# Patient Record
Sex: Female | Born: 1965 | Race: White | Hispanic: No | Marital: Married | Smoking: Never smoker
Health system: Southern US, Community
[De-identification: ages and names within clinical notes are randomized; demographics above are authoritative.]

## PROBLEM LIST (undated history)

## (undated) DIAGNOSIS — I82409 Acute embolism and thrombosis of unspecified deep veins of unspecified lower extremity: Secondary | ICD-10-CM

## (undated) HISTORY — PX: ELBOW ARTHROSCOPY: SUR87

## (undated) HISTORY — DX: Acute embolism and thrombosis of unspecified deep veins of unspecified lower extremity: I82.409

---

## 2004-08-18 ENCOUNTER — Ambulatory Visit: Payer: Self-pay | Admitting: Unknown Physician Specialty

## 2004-08-24 ENCOUNTER — Ambulatory Visit: Payer: Self-pay | Admitting: Unknown Physician Specialty

## 2005-09-13 ENCOUNTER — Ambulatory Visit: Payer: Self-pay | Admitting: Unknown Physician Specialty

## 2006-02-05 ENCOUNTER — Ambulatory Visit: Payer: Self-pay | Admitting: Internal Medicine

## 2006-02-21 ENCOUNTER — Ambulatory Visit: Payer: Self-pay | Admitting: Internal Medicine

## 2006-03-24 ENCOUNTER — Ambulatory Visit: Payer: Self-pay | Admitting: Internal Medicine

## 2006-04-10 ENCOUNTER — Ambulatory Visit: Payer: Self-pay | Admitting: Unknown Physician Specialty

## 2006-04-23 ENCOUNTER — Ambulatory Visit: Payer: Self-pay | Admitting: Internal Medicine

## 2006-05-08 ENCOUNTER — Ambulatory Visit: Payer: Self-pay

## 2006-05-16 ENCOUNTER — Ambulatory Visit: Payer: Self-pay | Admitting: Unknown Physician Specialty

## 2006-09-06 ENCOUNTER — Ambulatory Visit: Payer: Self-pay | Admitting: Unknown Physician Specialty

## 2006-11-30 ENCOUNTER — Ambulatory Visit: Payer: Self-pay | Admitting: Unknown Physician Specialty

## 2007-12-13 ENCOUNTER — Ambulatory Visit: Payer: Self-pay | Admitting: Orthopaedic Surgery

## 2007-12-19 ENCOUNTER — Ambulatory Visit: Payer: Self-pay | Admitting: Orthopaedic Surgery

## 2008-01-01 ENCOUNTER — Ambulatory Visit: Payer: Self-pay | Admitting: Unknown Physician Specialty

## 2008-01-13 ENCOUNTER — Ambulatory Visit: Payer: Self-pay | Admitting: Unknown Physician Specialty

## 2008-08-03 IMAGING — US ABDOMEN ULTRASOUND
1 series · 17 of 25 positions shown · non-contrast
Comparison: none

REASON FOR EXAM: Elevated transaminase
COMMENTS:

[Series 1: abdomen ultrasound · 17 of 66 slices shown]
[im 1/66]
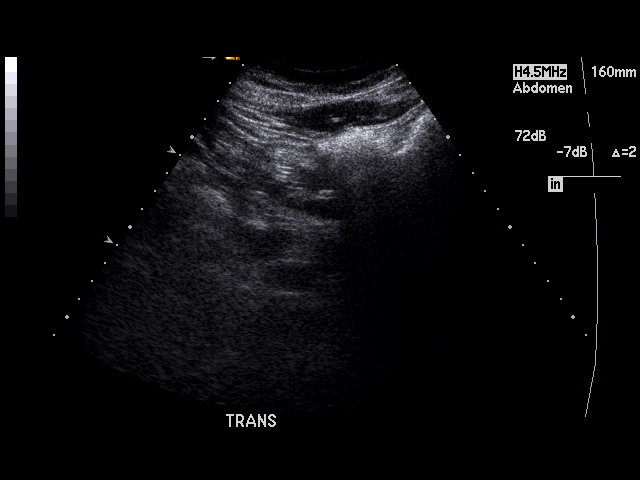
[im 6/66]
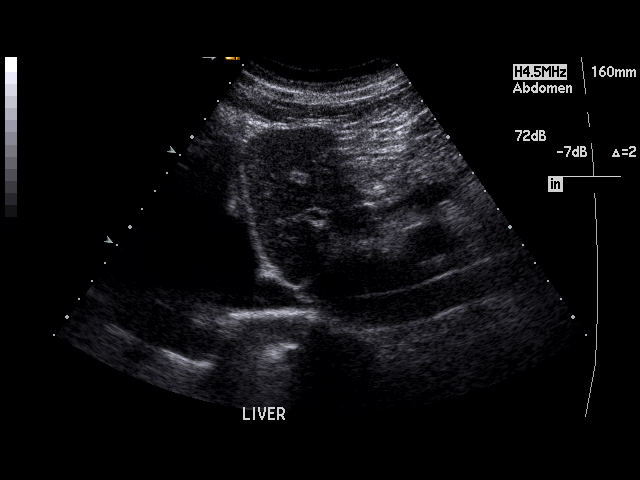
[im 9/66]
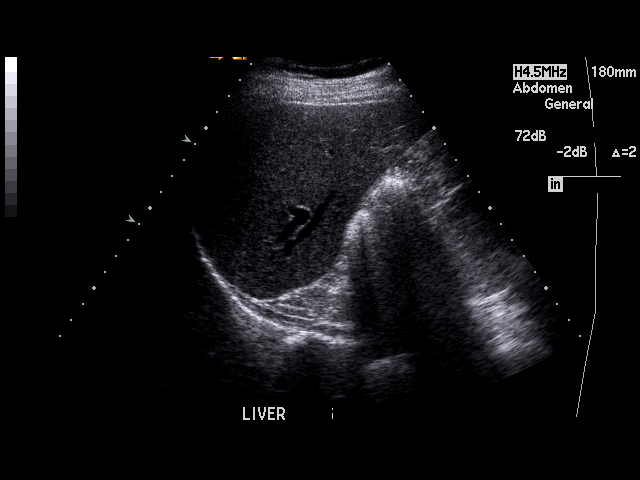
[im 14/66]
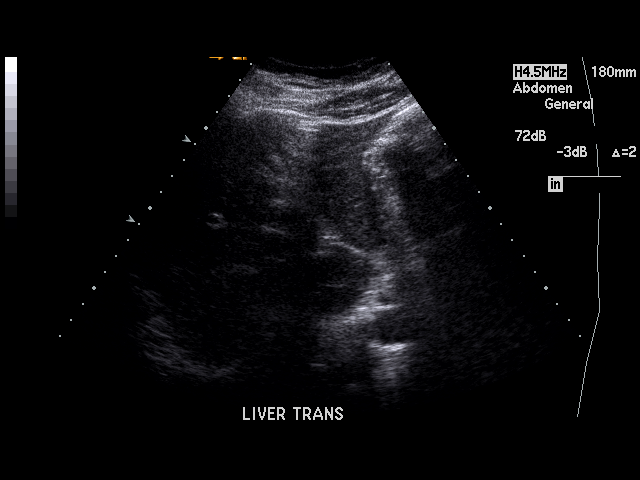
[im 17/66]
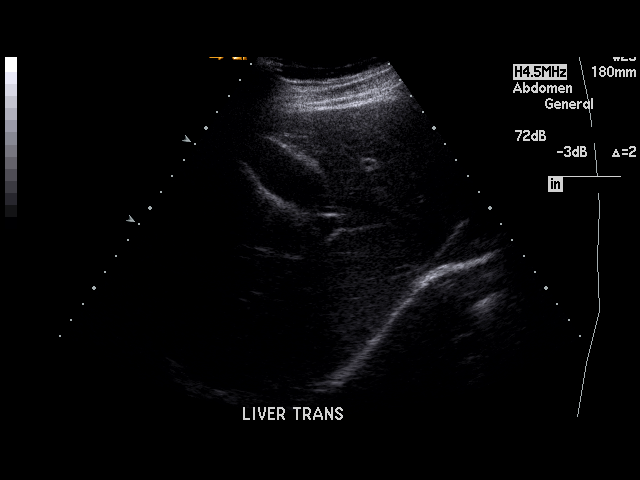
[im 22/66]
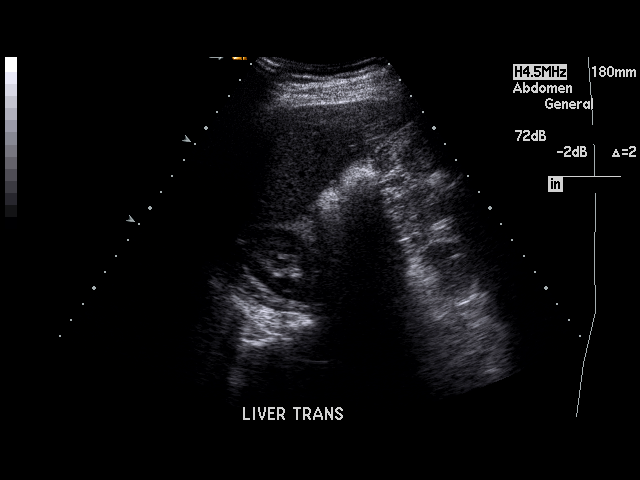
[im 25/66]
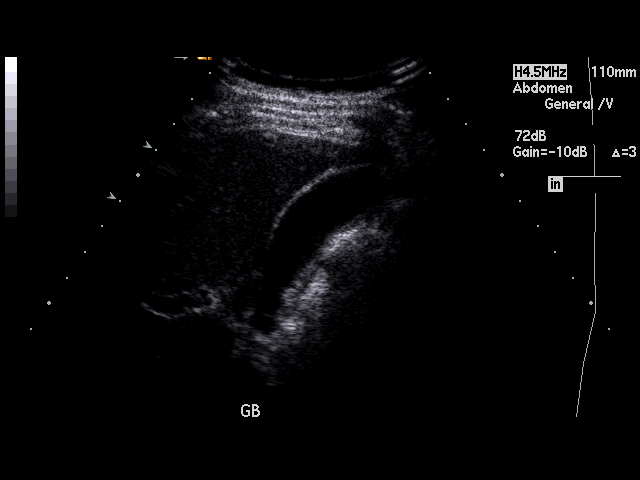
[im 30/66]
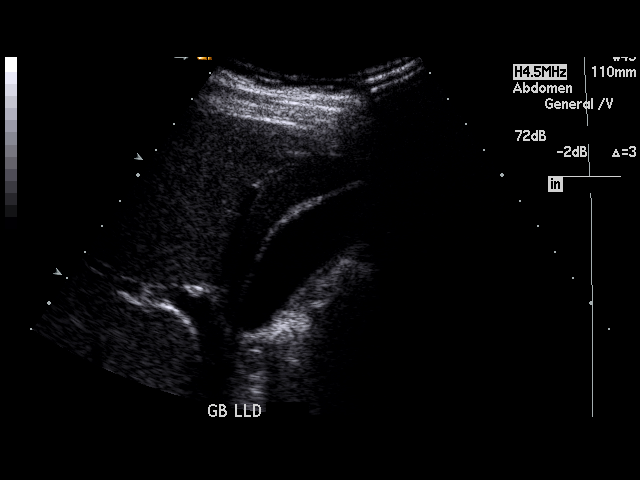
[im 33/66]
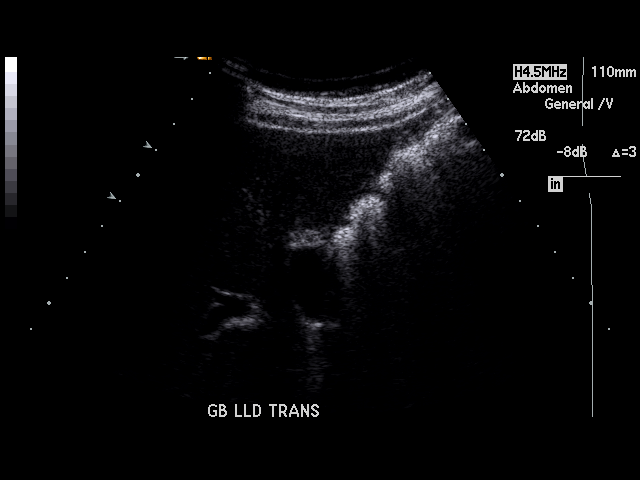
[im 36/66]
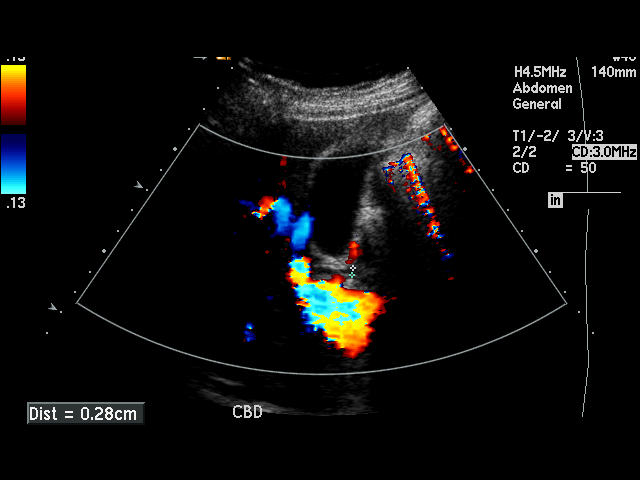
[im 41/66]
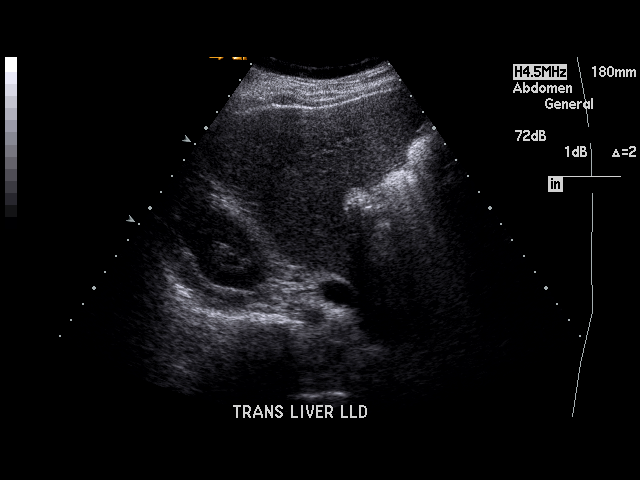
[im 44/66]
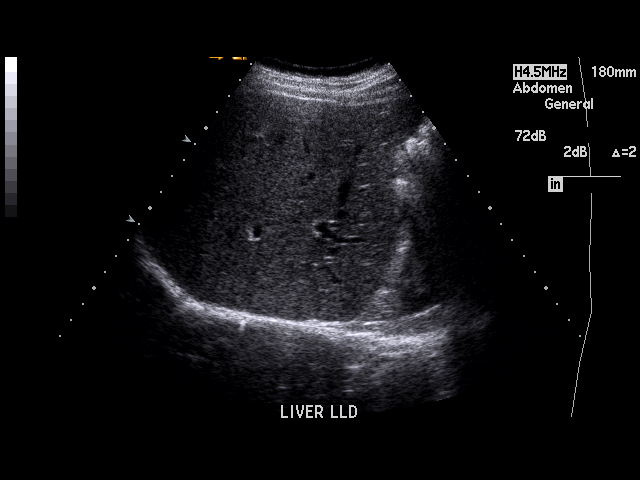
[im 49/66]
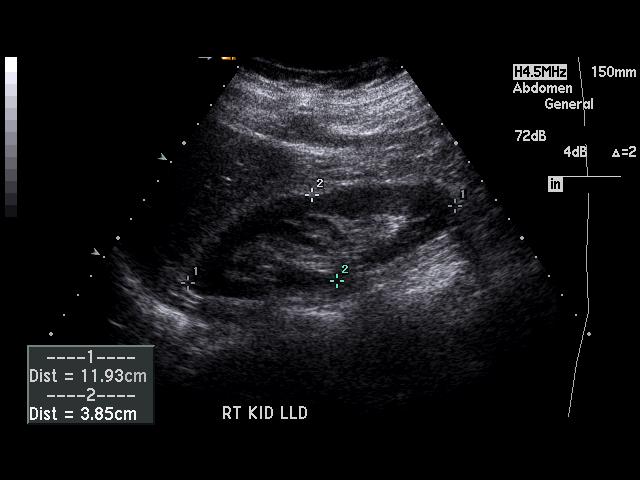
[im 52/66]
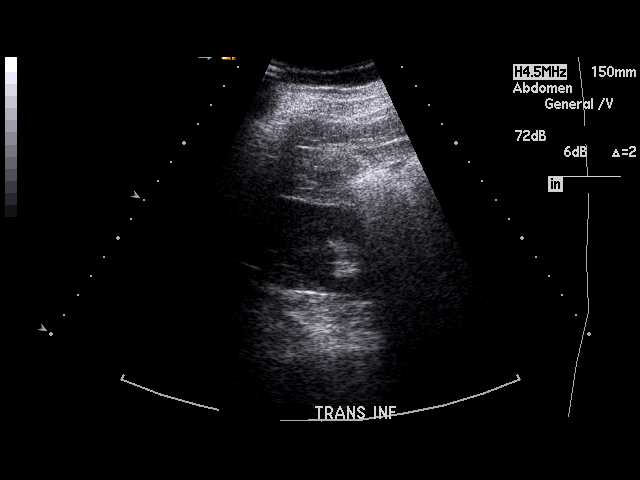
[im 57/66]
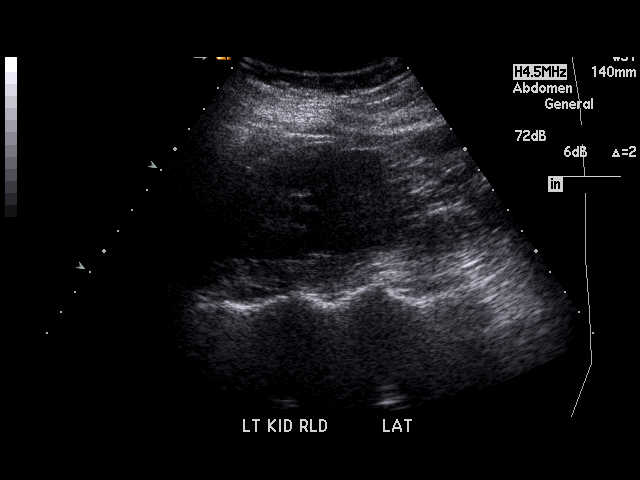
[im 60/66]
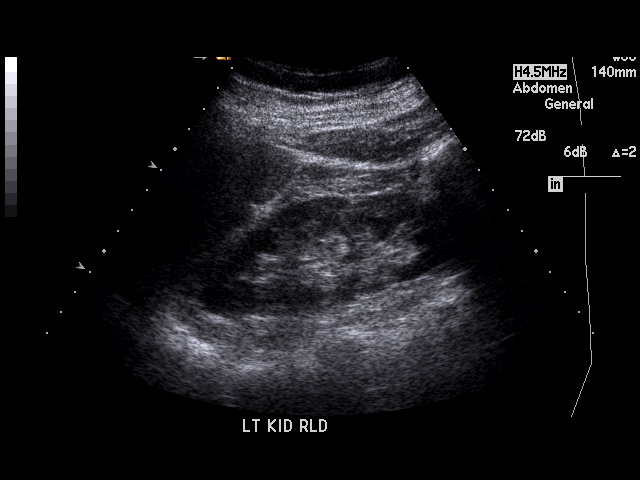
[im 66/66]
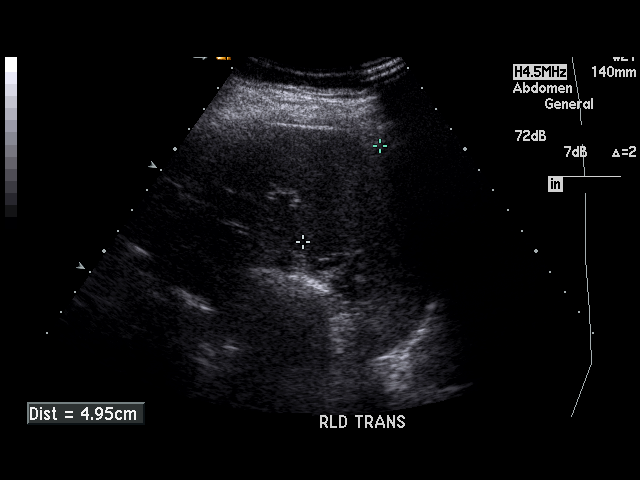

[17 of 25 positions shown; findings below may reference images not displayed]

PROCEDURE:     US  - US ABDOMEN GENERAL SURVEY  - April 10, 2006  [DATE]

RESULT:     The patient has elevated transaminases.

The liver exhibits normal echotexture with no mass or ductal dilation.  The
gallbladder is adequately distended with no evidence of stones, wall
thickening, or pericholecystic fluid.  There is no sonographic Murphy's
sign.  The common bile duct measures 2.8 mm in diameter.  The pancreas could
not be demonstrated due to the presence of bowel gas.  The spleen, kidneys,
and abdominal aorta are normal in appearance.  There is no evidence of
ascites.
IMPRESSION: I see no acute abnormality of the hepatobiliary tree but the pancreas could
not be demonstrated due to the presence of bowel gas.

## 2009-01-12 ENCOUNTER — Ambulatory Visit: Payer: Self-pay | Admitting: Unknown Physician Specialty

## 2010-01-13 ENCOUNTER — Ambulatory Visit: Payer: Self-pay | Admitting: Unknown Physician Specialty

## 2011-01-30 ENCOUNTER — Ambulatory Visit: Payer: Self-pay | Admitting: Unknown Physician Specialty

## 2012-04-23 ENCOUNTER — Ambulatory Visit: Payer: Self-pay | Admitting: Family Medicine

## 2012-04-23 ENCOUNTER — Other Ambulatory Visit: Payer: Self-pay | Admitting: Family Medicine

## 2012-04-23 LAB — APTT: Activated PTT: 26 secs (ref 23.6–35.9)

## 2012-10-10 ENCOUNTER — Ambulatory Visit: Payer: Self-pay | Admitting: Cardiology

## 2012-11-24 ENCOUNTER — Ambulatory Visit: Payer: Self-pay | Admitting: Family Medicine

## 2013-02-07 ENCOUNTER — Ambulatory Visit: Payer: Self-pay | Admitting: Family Medicine

## 2013-02-14 ENCOUNTER — Ambulatory Visit: Payer: Self-pay | Admitting: Family Medicine

## 2013-04-15 ENCOUNTER — Ambulatory Visit: Payer: Self-pay | Admitting: Cardiology

## 2013-09-24 ENCOUNTER — Ambulatory Visit: Payer: Self-pay | Admitting: Family Medicine

## 2015-01-08 ENCOUNTER — Other Ambulatory Visit: Payer: Self-pay | Admitting: Obstetrics and Gynecology

## 2015-01-08 DIAGNOSIS — N6489 Other specified disorders of breast: Secondary | ICD-10-CM

## 2015-01-19 ENCOUNTER — Other Ambulatory Visit: Payer: Commercial Managed Care - PPO

## 2015-01-19 ENCOUNTER — Ambulatory Visit: Payer: Commercial Managed Care - PPO

## 2015-01-20 ENCOUNTER — Ambulatory Visit: Payer: Commercial Managed Care - PPO

## 2015-01-20 ENCOUNTER — Ambulatory Visit
Admission: RE | Admit: 2015-01-20 | Discharge: 2015-01-20 | Disposition: A | Discharge status: Home / Self Care | Payer: Commercial Managed Care - PPO | Source: Ambulatory Visit | Attending: Obstetrics and Gynecology | Admitting: Obstetrics and Gynecology

## 2015-01-20 DIAGNOSIS — N6489 Other specified disorders of breast: Secondary | ICD-10-CM | POA: Insufficient documentation

## 2015-01-20 DIAGNOSIS — R87612 Low grade squamous intraepithelial lesion on cytologic smear of cervix (LGSIL): Secondary | ICD-10-CM | POA: Insufficient documentation

## 2015-01-21 ENCOUNTER — Other Ambulatory Visit: Payer: Commercial Managed Care - PPO

## 2015-01-21 ENCOUNTER — Ambulatory Visit: Payer: Commercial Managed Care - PPO

## 2015-04-22 ENCOUNTER — Encounter: Payer: Self-pay | Admitting: Family Medicine

## 2016-01-11 ENCOUNTER — Other Ambulatory Visit: Payer: Self-pay | Admitting: Obstetrics and Gynecology

## 2016-01-11 DIAGNOSIS — Z1231 Encounter for screening mammogram for malignant neoplasm of breast: Secondary | ICD-10-CM

## 2016-02-04 ENCOUNTER — Other Ambulatory Visit: Payer: Self-pay | Admitting: Obstetrics and Gynecology

## 2016-02-04 ENCOUNTER — Ambulatory Visit
Admission: RE | Admit: 2016-02-04 | Discharge: 2016-02-04 | Disposition: A | Discharge status: Home / Self Care | Payer: Commercial Managed Care - PPO | Source: Ambulatory Visit | Attending: Obstetrics and Gynecology | Admitting: Obstetrics and Gynecology

## 2016-02-04 DIAGNOSIS — Z1231 Encounter for screening mammogram for malignant neoplasm of breast: Secondary | ICD-10-CM

## 2016-06-06 ENCOUNTER — Ambulatory Visit (INDEPENDENT_AMBULATORY_CARE_PROVIDER_SITE_OTHER): Payer: Commercial Managed Care - PPO | Admitting: Sports Medicine

## 2016-06-06 ENCOUNTER — Ambulatory Visit (INDEPENDENT_AMBULATORY_CARE_PROVIDER_SITE_OTHER): Payer: Commercial Managed Care - PPO

## 2016-06-06 ENCOUNTER — Encounter: Payer: Self-pay | Admitting: Sports Medicine

## 2016-06-06 DIAGNOSIS — M79671 Pain in right foot: Secondary | ICD-10-CM

## 2016-06-06 DIAGNOSIS — M779 Enthesopathy, unspecified: Secondary | ICD-10-CM | POA: Diagnosis not present

## 2016-06-06 MED ORDER — TRIAMCINOLONE ACETONIDE 10 MG/ML IJ SUSP: 10.0000 mg | Freq: Once | INTRAMUSCULAR | Status: DC

## 2016-06-06 NOTE — Progress Notes (Signed)
Subjective: Leslie Jacobs is a 50 y.o. female patient who presents to office for evaluation of right foot pain. Patient complains of progressive pain especially over the last 3 weeks in the right foot at the ball; feel like she is walking on a marble that is worsened by barefoot walking. Patient has tried change in shoe, ice, changing way she walks with no relief in symptoms. Patient denies any other pedal complaints. Denies injury/trip/fall/sprain/any other causative factors.   Patient Active Problem List   Diagnosis Date Noted  . Low grade squamous intraepithelial lesion (LGSIL) on Papanicolaou smear of cervix 01/20/2015    No current outpatient prescriptions on file prior to visit.   No current facility-administered medications on file prior to visit.     Allergies  Allergen Reactions  . Hydrocodone-Acetaminophen Nausea Only  . Sulfa Antibiotics Other (See Comments)    Objective:  General: Alert and oriented x3 in no acute distress  Dermatology: No open lesions bilateral lower extremities, no webspace macerations, no ecchymosis bilateral, all nails x 10 are well manicured.  Vascular: Dorsalis Pedis and Posterior Tibial pedal pulses palpable, Capillary Fill Time 3 seconds,(+) pedal hair growth bilateral, no edema bilateral lower extremities, Temperature gradient within normal limits.  Neurology: Johney Maine sensation intact via light touch bilateral, (- )Tinels sign bilateral.   Musculoskeletal: Mild tenderness with palpation sub met 3 right foot with mild fat pad migration and lesser hammertoe,No pain with calf compression bilateral. Strength within normal limits in all groups bilateral.   Gait: Antalgic gait  Xrays  Right Foot   Impression: Normal osseous mineralization, square 2nd met head, plantarflex met, hammertoes, inferior calcaneal spur. No fracture. Soft tissues within normal limits.  Assessment and Plan: Problem List Items Addressed This Visit    None    Visit  Diagnoses    Right foot pain    -  Primary   Relevant Medications   triamcinolone acetonide (KENALOG) 10 MG/ML injection 10 mg (Start on 06/06/2016  9:00 AM)   Other Relevant Orders   DG Foot 2 Views Right   Capsulitis       Relevant Medications   triamcinolone acetonide (KENALOG) 10 MG/ML injection 10 mg (Start on 06/06/2016  9:00 AM)       -Complete examination performed -Xrays reviewed -Discussed treatement options for capsulitis secondary to foot type After oral consent and aseptic prep, injected a mixture containing 1 ml of 2% plain lidocaine, 1 ml 0.5% plain marcaine, 0.5 ml of kenalog 10 and 0.5 ml of dexamethasone phosphate into sub met 3 on right without complication. Post-injection care discussed with patient.  -Applied taping/removal met pad to right foot -Recommend ice, rest, good shoes daily. NO BAREFOOT WALKING.  -Patient to return to office as needed or sooner if condition worsens.  Landis Martins, DPM

## 2016-08-24 ENCOUNTER — Other Ambulatory Visit: Payer: Self-pay | Admitting: Obstetrics and Gynecology

## 2016-08-24 DIAGNOSIS — R590 Localized enlarged lymph nodes: Secondary | ICD-10-CM

## 2016-09-07 ENCOUNTER — Ambulatory Visit
Admission: RE | Admit: 2016-09-07 | Discharge: 2016-09-07 | Disposition: A | Discharge status: Home / Self Care | Payer: Commercial Managed Care - PPO | Source: Ambulatory Visit | Attending: Obstetrics and Gynecology | Admitting: Obstetrics and Gynecology

## 2016-09-07 ENCOUNTER — Encounter: Payer: Self-pay | Admitting: Radiology

## 2016-09-07 DIAGNOSIS — R590 Localized enlarged lymph nodes: Secondary | ICD-10-CM | POA: Diagnosis present

## 2016-09-07 DIAGNOSIS — N6310 Unspecified lump in the right breast, unspecified quadrant: Secondary | ICD-10-CM | POA: Insufficient documentation

## 2017-01-11 ENCOUNTER — Other Ambulatory Visit: Payer: Self-pay | Admitting: Obstetrics and Gynecology

## 2017-01-11 DIAGNOSIS — Z1231 Encounter for screening mammogram for malignant neoplasm of breast: Secondary | ICD-10-CM

## 2017-02-09 ENCOUNTER — Ambulatory Visit
Admission: RE | Admit: 2017-02-09 | Discharge: 2017-02-09 | Disposition: A | Discharge status: Home / Self Care | Payer: Commercial Managed Care - PPO | Source: Ambulatory Visit | Attending: Obstetrics and Gynecology | Admitting: Obstetrics and Gynecology

## 2017-02-09 DIAGNOSIS — Z1231 Encounter for screening mammogram for malignant neoplasm of breast: Secondary | ICD-10-CM | POA: Diagnosis present

## 2017-05-18 DIAGNOSIS — K900 Celiac disease: Secondary | ICD-10-CM | POA: Insufficient documentation

## 2017-05-18 DIAGNOSIS — I10 Essential (primary) hypertension: Secondary | ICD-10-CM | POA: Insufficient documentation

## 2018-01-15 ENCOUNTER — Other Ambulatory Visit: Payer: Self-pay | Admitting: Obstetrics and Gynecology

## 2018-01-15 DIAGNOSIS — Z1231 Encounter for screening mammogram for malignant neoplasm of breast: Secondary | ICD-10-CM

## 2018-02-15 ENCOUNTER — Ambulatory Visit
Admission: RE | Admit: 2018-02-15 | Discharge: 2018-02-15 | Disposition: A | Discharge status: Home / Self Care | Payer: Commercial Managed Care - PPO | Source: Ambulatory Visit | Attending: Obstetrics and Gynecology | Admitting: Obstetrics and Gynecology

## 2018-02-15 DIAGNOSIS — Z1231 Encounter for screening mammogram for malignant neoplasm of breast: Secondary | ICD-10-CM | POA: Diagnosis present

## 2018-04-26 DIAGNOSIS — S46912A Strain of unspecified muscle, fascia and tendon at shoulder and upper arm level, left arm, initial encounter: Secondary | ICD-10-CM | POA: Insufficient documentation

## 2018-05-03 ENCOUNTER — Ambulatory Visit: Payer: Commercial Managed Care - PPO

## 2018-05-17 ENCOUNTER — Other Ambulatory Visit: Payer: Self-pay

## 2018-05-17 ENCOUNTER — Ambulatory Visit: Payer: Commercial Managed Care - PPO | Attending: Obstetrics and Gynecology

## 2018-05-17 DIAGNOSIS — M533 Sacrococcygeal disorders, not elsewhere classified: Secondary | ICD-10-CM | POA: Insufficient documentation

## 2018-05-17 DIAGNOSIS — M62838 Other muscle spasm: Secondary | ICD-10-CM | POA: Insufficient documentation

## 2018-05-17 DIAGNOSIS — N8110 Cystocele, unspecified: Secondary | ICD-10-CM

## 2018-05-17 NOTE — Patient Instructions (Signed)
Stabilization: Diaphragmatic Breathing    Lie with knees bent, feet flat. Place one hand on stomach, other on chest. Breathe deeply through nose, lifting belly hand without any motion of hand on chest. 3-5 minutes each day.  Child's Pose Pelvic Floor Lengthening    Sit in knee-chest position and reach arms forward. Separate knees for comfort. Hold position for _5__ breaths. Repeat _2-3__ times. Do _1-2__ times per day.    Hold for 30 seconds (5 deep breaths) and repeat 2-3 times on each side once a day

## 2018-05-17 NOTE — Therapy (Signed)
[place] [facility] MAIN [facility] SERVICES [address] [place], [place], [address] Phone: [phone]   Fax:  [phone]  Physical Therapy Evaluation  Patient Details  Name: Leslie Jacobs MRN: [id number] Date of Birth: [birth year] Referring Provider (PT): Dr. Christeen Douglas   Encounter Date: 05/17/2018    No past medical history on file.  No past surgical history on file.  There were no vitals filed for this visit.       [facility] PT Assessment - 05/17/18 0001      Assessment   Medical Diagnosis  incomplete uterovaginal prolapse    Referring Provider (PT)  Dr. Christeen Douglas    Onset Date/Surgical Date  01/15/18    Next MD Visit  6/20    Prior Therapy  none      Precautions   Precautions  None      Restrictions   Weight Bearing Restrictions  No      Balance Screen   Has the patient fallen in the past 6 months  No      Home Environment   Living Environment  Private residence    Living Arrangements  Spouse/significant other    Available Help at Discharge  Family    Type of Home  House    Home Access  Stairs to enter    Entrance Stairs-Number of Steps  3    Entrance Stairs-Rails  Left    Home Layout  Two level    Alternate Level Stairs-Number of Steps  20    Alternate Level Stairs-Rails  Right    Home Equipment  None      Prior Function   Level of Independence  Independent    Vocation  Full time employment    Company secretary   standing, walking, drives   Leisure  Exercise kickboxing, Education administrator, hitt, pilates, ~4-5 days/week      Cognition   Overall Cognitive Status  Within Functional Limits for tasks assessed        Pelvic Floor Physical Therapy Evaluation and Assessment  SCREENING  Falls in last 6 mo: no     Red Flags:  Have you had any night sweats? Yes, perimenapausal Unexplained weight loss? no Saddle anesthesia? no Unexplained changes in bowel or bladder habits?  no  SUBJECTIVE  Patient reports: She started noticing a heaviness and could palpate a bulge in the shower ~ 3-4 months ago.  Social/Family/Vocational History:   Working full time as Academic librarian  Recent Procedures/Tests/Findings:  none  Obstetrical History: Had a fourth degree tear with last child, had a c-section then v-back.  Gynecological History: none  Urinary History: Has urinary frequency occasionally can feel the urge to go as early as 20 min. After emptying bladder but can hold off up to 3 hours if distracted/working.  Gastrointestinal History: Daily, not usually straining ~ grade 4   Sexual activity/pain: Can have pain with intercourse since noticing prolapse.  Patient Goals: Wants to not have a prolapse.    OBJECTIVE  Posture/Observations:  Sitting:  Standing: L iliac crest high, L ASIS high, L PSIS low  L anterior rotation and up-slip. ~ 1/2 cm leg-length difference pre-treatment.    Palpation/Segmental Motion/Joint Play: Not TTP through adductors, TTP through lumbar paraspinals B with L>R  Special tests:   Stork: L side mild instability, R hypomobility Scoliosis: mild R lumbar curve. Leg-length: L 90.5, R 90mm Supine to long-sit: L long in supine and seated.  Range of Motion/Flexibilty:  Spine: ~50% decreased rotation to L and ~ 35% decreased to R. SB WNL but little motion coming from lumbar spine.  Hips: not assessed   Strength/MMT: Deferred to follow-up visit LE MMT  LE MMT Left Right  Hip flex:  (L2) /5 /5  Hip ext: /5 /5  Hip abd: /5 /5  Hip add: /5 /5  Hip IR /5 /5  Hip ER /5 /5     Abdominal:  Palpation: TTP B (L>R) to Psoas only Diastasis: none  Pelvic Floor External Exam: Introitus Appears: gaping Skin integrity: normal Palpation: no TTP Cough: paradoxical Prolapse visible?: yes Scar mobility: some decreased mobility  Internal Vaginal Exam: Strength (PERF): 2/5, 8 seconds,1 time Symmetry: L>R for  tenderness Palpation: TTP throughout, L>R Prolapse: anterior and posterior walls visible at the level of the introitus.  Gait Analysis: not assessed   Pelvic Floor Outcome Measures: PFDI: 39/300  INTERVENTIONS THIS SESSION: Self-care: Educated on the structure and function of the pelvic floor in relation to their symptoms as well as the POC, and initial HEP in order to set patient expectations and understanding from which we will build on in the future sessions. Therex: educated on and practiced diaphragmatic breathing, side-stretch, and child's pose to begin lengthening the PFM and decrease imbalance of musculature surrounding the pelvis.  Total time: 60 min.         Objective measurements completed on examination: See above findings.                PT Short Term Goals - 05/17/18 1115      PT SHORT TERM GOAL #1   Title  Patient will demonstrate functional recruitment of TA with breathing, sit-to-stand, squatting/lifting, and walking to allow for improved pelvic brace coordination, improved balance, and decreased downward pressure on the pelvic organs.    Baseline  chest breathing    Time  6    Period  Weeks    Status  New    Target Date  06/28/18      PT SHORT TERM GOAL #2   Title  Patient will demonstrate improved pelvic alignment and balance of musculature surrounding the pelvis to facilitate decreased PFM spasms and decrease pelvic pain.    Baseline  L anterior rotation and up-slip (possible leg-length discrepancy)    Time  6    Period  Weeks    Status  New    Target Date  06/28/18      PT SHORT TERM GOAL #3   Title  Patient will demonstrate HEP x1 in the clinic to demonstrate understanding and proper form to allow for further improvement.    Time  6    Period  Weeks    Status  New    Target Date  06/28/18        PT Long Term Goals - 05/17/18 1112      PT LONG TERM GOAL #1   Title  Patient will score at or below 20 on the PFDI to demonstrate a  clinically meaningful decrease in disability and distress due to pelvic floor dysfunction.    Baseline  PFDI: 39/300    Time  12    Period  Weeks    Status  New    Target Date  08/09/18      PT LONG TERM GOAL #2   Title  Patient will demonstrate 4+/5 strength or better in the PF and surrounding musculature to show improved functional strength for childcare and other vocational and recreational  activities.     Baseline  2/5 for PFM    Time  12    Period  Weeks    Status  New    Target Date  08/09/18      PT LONG TERM GOAL #3   Title  Patient will report no pain with intercourse to demonstrate improved functional ability.    Baseline  occasional pain with intercourse    Time  12    Period  Weeks    Status  New    Target Date  08/09/18      PT LONG TERM GOAL #4   Title  Patient will report urinating 6-8 times per day over the course of the prior week to demonstrate decreased frequency.    Baseline  Has urge up to every 20 min. at times, can delay if "ignores" it.    Time  12    Period  Weeks    Status  New    Target Date  08/09/18      PT LONG TERM GOAL #5   Title  Patient will describe feeling of pressure no more than 10% of the time over the course of the past week to demonstrate improved recruitment and strength of the pelvic floor.    Time  12    Period  Weeks    Status  New    Target Date  08/09/18             Plan - 05/17/18 1125    Clinical Impression Statement  Pt. is a 52 y/o female who presents today with cheif c/o vaginal heaviness/bulge and occasional pain in the low back and vaginally with intercourse. Her relevant PMH includes 1 vaginal delivery with grade 4 tearing and a c-section. Her clinical exam reveals a L posterior rotation and up-slip with possible leg-length difference to be confirmed following correction of obliquity as well as spasms surrounding and within the pelvis and weak PFM. She will benefit from skilled pelvic PT to address noted defecits  and to continue to assess for and address other potential causes of pain and prolapse Sx.     History and Personal Factors relevant to plan of care:  1 c-section, 1 vaginal delivery with grade 4 tear    Clinical Presentation  Stable    Clinical Decision Making  Moderate    Rehab Potential  Good    PT Frequency  1x / week    PT Duration  12 weeks    PT Treatment/Interventions  ADLs/Self Care Home Management;Biofeedback;Electrical Stimulation;Traction;Moist Heat;Functional mobility training;Therapeutic activities;Therapeutic exercise;Patient/family education;Neuromuscular re-education;Scar mobilization;Taping;Dry needling;Spinal Manipulations;Joint Manipulations    PT Next Visit Plan  address L up-slip/posterior rotation and re-measure leg-length. Hip flexor stretch Edcuate on self posterior fourchette massage, perform TP release internally    PT Home Exercise Plan  diaphragmatic breathing, child's pose, side-stretch    Consulted and Agree with Plan of Care  Patient       Patient will benefit from skilled therapeutic intervention in order to improve the following deficits and impairments:  Pain, Decreased coordination, Decreased scar mobility, Increased muscle spasms, Postural dysfunction, Decreased activity tolerance, Decreased endurance, Decreased strength, Impaired flexibility, Hypomobility  Visit Diagnosis: Other muscle spasm  Sacrococcygeal disorders, not elsewhere classified  Vaginal prolapse     Problem List Patient Active Problem List   Diagnosis Date Noted  . Low grade squamous intraepithelial lesion (LGSIL) on Papanicolaou smear of cervix 01/20/2015   Cleophus Molt DPT, ATC Cleophus Molt 05/17/2018,  11:42 AM  [place] [facility] MAIN [facility] SERVICES [address] [place], [place], [address] Phone: [phone]   Fax:  [phone]  Name: Leslie Jacobs MRN: [id number] Date of Birth: [birth year]

## 2018-05-24 ENCOUNTER — Ambulatory Visit: Payer: Commercial Managed Care - PPO | Attending: Obstetrics and Gynecology

## 2018-05-24 DIAGNOSIS — N8110 Cystocele, unspecified: Secondary | ICD-10-CM | POA: Diagnosis present

## 2018-05-24 DIAGNOSIS — M533 Sacrococcygeal disorders, not elsewhere classified: Secondary | ICD-10-CM

## 2018-05-24 DIAGNOSIS — M62838 Other muscle spasm: Secondary | ICD-10-CM | POA: Diagnosis not present

## 2018-05-24 NOTE — Therapy (Signed)
[place] MAIN [facility] SERVICES [address] [place], [place], [address] Phone: [phone]   Fax:  [phone]  Physical Therapy Treatment  Patient Details  Name: Leslie Jacobs MRN: [id number] Date of Birth: February 21, 1966 Referring Provider (PT): Dr. Benjaman Kindler   Encounter Date: 05/24/2018  PT End of Session - 05/27/18 1122    Visit Number  2    Number of Visits  12    Date for PT Re-Evaluation  08/09/17    PT Start Time  1000    PT Stop Time  1100    PT Time Calculation (min)  60 min    Activity Tolerance  Patient tolerated treatment well    Behavior During Therapy  [facility] for tasks assessed/performed       No past medical history on file.  No past surgical history on file.  There were no vitals filed for this visit.   Pelvic Floor Physical Therapy Treatment Note  SCREENING  Changes in medications, allergies, or medical history?: no   SUBJECTIVE  Patient reports: No changes, has done her exercises all but one day.    Pain update: No pain  Patient Goals: Wants to not have a prolapse.    OBJECTIVE  Changes in: Posture/Observations:  L posterior rotation and up-slip  Range of Motion/Flexibilty:  Decreased sacral mobility on L>R  Palpation: TTP through  L QL, lumbar paraspinals, Glute min.  INTERVENTIONS THIS SESSION: Manual: Performed PA mobs to L>R sacral border and TP release to L QL, lumbar paraspinals, Glute min. Followed by MET correction for posterior rotation and up-slip correction for L hip to improve alignment and allow for decreased mal-alignment and pressure on lumbosacral nerve roots to allow for improved recruitment of PFM.  Therex: educated on and practiced self MET correction to decrease R anterior/L posterior rotation and decrease pressure on nerve roots and allow for improved muscular control.   Total time: 60 min.                           PT Education - 05/27/18 1121     Education Details  See Pt. Instructions and Interventions this session.    Person(s) Educated  Patient    Methods  Explanation    Comprehension  Verbalized understanding       PT Short Term Goals - 05/17/18 1115      PT SHORT TERM GOAL #1   Title  Patient will demonstrate functional recruitment of TA with breathing, sit-to-stand, squatting/lifting, and walking to allow for improved pelvic brace coordination, improved balance, and decreased downward pressure on the pelvic organs.    Baseline  chest breathing    Time  6    Period  Weeks    Status  New    Target Date  06/28/18      PT SHORT TERM GOAL #2   Title  Patient will demonstrate improved pelvic alignment and balance of musculature surrounding the pelvis to facilitate decreased PFM spasms and decrease pelvic pain.    Baseline  L anterior rotation and up-slip (possible leg-length discrepancy)    Time  6    Period  Weeks    Status  New    Target Date  06/28/18      PT SHORT TERM GOAL #3   Title  Patient will demonstrate HEP x1 in the clinic to demonstrate understanding and proper form to allow for further improvement.    Time  6    Period  Weeks    Status  New    Target Date  06/28/18        PT Long Term Goals - 05/17/18 1112      PT LONG TERM GOAL #1   Title  Patient will score at or below 20 on the PFDI to demonstrate a clinically meaningful decrease in disability and distress due to pelvic floor dysfunction.    Baseline  PFDI: 39/300    Time  12    Period  Weeks    Status  New    Target Date  08/09/18      PT LONG TERM GOAL #2   Title  Patient will demonstrate 4+/5 strength or better in the PF and surrounding musculature to show improved functional strength for childcare and other vocational and recreational activities.     Baseline  2/5 for PFM    Time  12    Period  Weeks    Status  New    Target Date  08/09/18      PT LONG TERM GOAL #3   Title  Patient will report no pain with intercourse to  demonstrate improved functional ability.    Baseline  occasional pain with intercourse    Time  12    Period  Weeks    Status  New    Target Date  08/09/18      PT LONG TERM GOAL #4   Title  Patient will report urinating 6-8 times per day over the course of the prior week to demonstrate decreased frequency.    Baseline  Has urge up to every 20 min. at times, can delay if "ignores" it.    Time  12    Period  Weeks    Status  New    Target Date  08/09/18      PT LONG TERM GOAL #5   Title  Patient will describe feeling of pressure no more than 10% of the time over the course of the past week to demonstrate improved recruitment and strength of the pelvic floor.    Time  12    Period  Weeks    Status  New    Target Date  08/09/18            Plan - 05/27/18 1126    Clinical Impression Statement  Pt. respoded well to all treatments today, demonstrating improved though still less than ptimal sacral mobilty and pelvic alignment as well as decreased spasms and understanding of all education provided. Continue per POC.    Clinical Presentation  Stable    Clinical Decision Making  Moderate    Rehab Potential  Good    PT Frequency  1x / week    PT Duration  12 weeks    PT Treatment/Interventions  ADLs/Self Care Home Management;Biofeedback;Electrical Stimulation;Traction;Moist Heat;Functional mobility training;Therapeutic activities;Therapeutic exercise;Patient/family education;Neuromuscular re-education;Scar mobilization;Taping;Dry needling;Spinal Manipulations;Joint Manipulations    PT Next Visit Plan  Perform internal TP release on L>R and teach kegel with sit-to-stand and posterior pelvic tilt. Continue to address L up-slip/posterior rotation Hip flexor stretch Edcuate on self posterior fourchette massage, perform TP release internally    PT Home Exercise Plan  diaphragmatic breathing, child's pose, side-stretch, MET correction for L posterior rotation.    Consulted and Agree with Plan  of Care  Patient       Patient will benefit from skilled therapeutic intervention in order to improve the following deficits and impairments:  Pain,  Decreased coordination, Decreased scar mobility, Increased muscle spasms, Postural dysfunction, Decreased activity tolerance, Decreased endurance, Decreased strength, Impaired flexibility, Hypomobility  Visit Diagnosis: Other muscle spasm  Sacrococcygeal disorders, not elsewhere classified  Vaginal prolapse     Problem List Patient Active Problem List   Diagnosis Date Noted  . Low grade squamous intraepithelial lesion (LGSIL) on Papanicolaou smear of cervix 01/20/2015   Willa Rough DPT, ATC Willa Rough 05/27/2018, 11:31 AM  [place] MAIN [facility] SERVICES [address] [place], [place], [address] Phone: [phone]   Fax:  [phone]  Name: CHITARA CLONCH MRN: [id number] Date of Birth: May 18, 1966

## 2018-05-31 ENCOUNTER — Ambulatory Visit: Payer: Commercial Managed Care - PPO

## 2018-06-07 ENCOUNTER — Ambulatory Visit: Payer: Commercial Managed Care - PPO

## 2018-06-07 DIAGNOSIS — M533 Sacrococcygeal disorders, not elsewhere classified: Secondary | ICD-10-CM

## 2018-06-07 DIAGNOSIS — M62838 Other muscle spasm: Secondary | ICD-10-CM | POA: Diagnosis not present

## 2018-06-07 DIAGNOSIS — N8110 Cystocele, unspecified: Secondary | ICD-10-CM

## 2018-06-07 NOTE — Patient Instructions (Signed)
Kegel exercises:    With neutral spine, tighten pelvic floor by imagining you are stopping the flow of urine, squeezing only around the vagina and anus.  Quick-Flicks: Pull up and in quickly and then relax allowing just enough time for the muscles to full lengthen before the next contraction. Do _10__ repetitions in a row, stopping if the pelvic floor muscle gets tired and other muscles try to take over.  Long-Holds: Hold for _5__ seconds and then fully release, repeat _5__ times.   Repeat both of these exercises __5_ times throughout the day

## 2018-06-07 NOTE — Therapy (Signed)
[place] MAIN [facility] SERVICES [address] [place], [place], [address] Phone: [phone]   Fax:  [phone]  Physical Therapy Treatment  Patient Details  Name: Leslie Jacobs MRN: [id number] Date of Birth: August 17, 1965 Referring Provider (PT): Dr. Benjaman Kindler   Encounter Date: 06/07/2018  PT End of Session - 06/13/18 1309    Visit Number  3    Number of Visits  12    Date for PT Re-Evaluation  08/09/17    PT Start Time  0900    PT Stop Time  1000    PT Time Calculation (min)  60 min    Activity Tolerance  Patient tolerated treatment well    Behavior During Therapy  [facility] for tasks assessed/performed       No past medical history on file.  No past surgical history on file.  There were no vitals filed for this visit.    Pelvic Floor Physical Therapy Treatment Note  SCREENING  Changes in medications, allergies, or medical history?: no    SUBJECTIVE  Patient reports: No change, is doing her exercises.  Pain update: No pain  Patient Goals: Wants to not have a prolapse.    OBJECTIVE  Changes in: Posture/Observations:  Continues to demonstrate mild L up-slip and rotation, mild improvement from previous session.  Pelvic floor: TTP on L>R, decreased recruitment and strength pre-treatment but increased to ~ 3+/5 following treatment.   INTERVENTIONS THIS SESSION: Manual: Performed TP release to B PFM to decrease spasm and allow for improved recruitment and strengthening to support prolapse. NM Re-ed: Educated on how to perform a coordinated contraction, relaxation, and bulge of the PFM to allow for improved strengthening and timing to decrease prolapse and prevent SUI. Therex: Educated on and practiced quick-flick and long-hold kegels for PFM strengthening.  Total time: 60 min.                          PT Education - 06/13/18 1309    Education Details  See Pt. Instructions and  Interventions this session.    Person(s) Educated  Patient    Methods  Explanation;Tactile cues;Verbal cues;Handout    Comprehension  Verbalized understanding;Returned demonstration;Verbal cues required;Tactile cues required       PT Short Term Goals - 05/17/18 1115      PT SHORT TERM GOAL #1   Title  Patient will demonstrate functional recruitment of TA with breathing, sit-to-stand, squatting/lifting, and walking to allow for improved pelvic brace coordination, improved balance, and decreased downward pressure on the pelvic organs.    Baseline  chest breathing    Time  6    Period  Weeks    Status  New    Target Date  06/28/18      PT SHORT TERM GOAL #2   Title  Patient will demonstrate improved pelvic alignment and balance of musculature surrounding the pelvis to facilitate decreased PFM spasms and decrease pelvic pain.    Baseline  L anterior rotation and up-slip (possible leg-length discrepancy)    Time  6    Period  Weeks    Status  New    Target Date  06/28/18      PT SHORT TERM GOAL #3   Title  Patient will demonstrate HEP x1 in the clinic to demonstrate understanding and proper form to allow for further improvement.    Time  6    Period  Weeks    Status  New    Target Date  06/28/18        PT Long Term Goals - 05/17/18 1112      PT LONG TERM GOAL #1   Title  Patient will score at or below 20 on the PFDI to demonstrate a clinically meaningful decrease in disability and distress due to pelvic floor dysfunction.    Baseline  PFDI: 39/300    Time  12    Period  Weeks    Status  New    Target Date  08/09/18      PT LONG TERM GOAL #2   Title  Patient will demonstrate 4+/5 strength or better in the PF and surrounding musculature to show improved functional strength for childcare and other vocational and recreational activities.     Baseline  2/5 for PFM    Time  12    Period  Weeks    Status  New    Target Date  08/09/18      PT LONG TERM GOAL #3   Title   Patient will report no pain with intercourse to demonstrate improved functional ability.    Baseline  occasional pain with intercourse    Time  12    Period  Weeks    Status  New    Target Date  08/09/18      PT LONG TERM GOAL #4   Title  Patient will report urinating 6-8 times per day over the course of the prior week to demonstrate decreased frequency.    Baseline  Has urge up to every 20 min. at times, can delay if "ignores" it.    Time  12    Period  Weeks    Status  New    Target Date  08/09/18      PT LONG TERM GOAL #5   Title  Patient will describe feeling of pressure no more than 10% of the time over the course of the past week to demonstrate improved recruitment and strength of the pelvic floor.    Time  12    Period  Weeks    Status  New    Target Date  08/09/18            Plan - 06/13/18 1310    Clinical Impression Statement  Pt. respoded well to all interventions today, demonstrating improved recruitment and coordination of the PFM following treatment and ability to perform kegels appropriately for PFM strengthening. Continue per POC.    Clinical Presentation  Stable    Clinical Decision Making  Moderate    Rehab Potential  Good    PT Frequency  1x / week    PT Duration  12 weeks    PT Treatment/Interventions  ADLs/Self Care Home Management;Biofeedback;Electrical Stimulation;Traction;Moist Heat;Functional mobility training;Therapeutic activities;Therapeutic exercise;Patient/family education;Neuromuscular re-education;Scar mobilization;Taping;Dry needling;Spinal Manipulations;Joint Manipulations    PT Next Visit Plan  Perform internal TP release on L>R and teach kegel with sit-to-stand and posterior pelvic tilt. Continue to address L up-slip/posterior rotation Hip flexor stretch Edcuate on self posterior fourchette massage, perform TP release internally, kegels (long-hold 5 sec)    PT Home Exercise Plan  diaphragmatic breathing, child's pose, side-stretch, MET  correction for L posterior rotation.    Consulted and Agree with Plan of Care  Patient       Patient will benefit from skilled therapeutic intervention in order to improve the following deficits and impairments:  Pain, Decreased coordination, Decreased scar mobility, Increased muscle spasms, Postural dysfunction, Decreased activity  tolerance, Decreased endurance, Decreased strength, Impaired flexibility, Hypomobility  Visit Diagnosis: Other muscle spasm  Sacrococcygeal disorders, not elsewhere classified  Vaginal prolapse     Problem List Patient Active Problem List   Diagnosis Date Noted  . Low grade squamous intraepithelial lesion (LGSIL) on Papanicolaou smear of cervix 01/20/2015   Willa Rough DPT, ATC Willa Rough 06/13/2018, 1:13 PM  [place] MAIN [facility] SERVICES [address] [place], [place], [address] Phone: [phone]   Fax:  [phone]  Name: Leslie Jacobs MRN: [id number] Date of Birth: 1965/09/05

## 2018-06-14 ENCOUNTER — Ambulatory Visit: Payer: Commercial Managed Care - PPO

## 2018-06-28 ENCOUNTER — Ambulatory Visit: Payer: Commercial Managed Care - PPO | Attending: Obstetrics and Gynecology

## 2018-07-05 ENCOUNTER — Ambulatory Visit: Payer: Commercial Managed Care - PPO

## 2018-07-12 ENCOUNTER — Ambulatory Visit: Payer: Commercial Managed Care - PPO

## 2019-01-21 ENCOUNTER — Other Ambulatory Visit: Payer: Self-pay | Admitting: Obstetrics and Gynecology

## 2019-01-21 DIAGNOSIS — Z1231 Encounter for screening mammogram for malignant neoplasm of breast: Secondary | ICD-10-CM

## 2019-01-21 LAB — HM PAP SMEAR: HM Pap smear: NORMAL

## 2019-02-28 ENCOUNTER — Ambulatory Visit
Admission: RE | Admit: 2019-02-28 | Discharge: 2019-02-28 | Disposition: A | Discharge status: Home / Self Care | Payer: Commercial Managed Care - PPO | Source: Ambulatory Visit | Attending: Obstetrics and Gynecology | Admitting: Obstetrics and Gynecology

## 2019-02-28 DIAGNOSIS — Z1231 Encounter for screening mammogram for malignant neoplasm of breast: Secondary | ICD-10-CM | POA: Diagnosis present

## 2019-03-11 ENCOUNTER — Other Ambulatory Visit: Payer: Self-pay | Admitting: Obstetrics and Gynecology

## 2019-03-11 DIAGNOSIS — R921 Mammographic calcification found on diagnostic imaging of breast: Secondary | ICD-10-CM

## 2019-03-11 DIAGNOSIS — R928 Other abnormal and inconclusive findings on diagnostic imaging of breast: Secondary | ICD-10-CM

## 2019-03-18 ENCOUNTER — Ambulatory Visit
Admission: RE | Admit: 2019-03-18 | Discharge: 2019-03-18 | Disposition: A | Discharge status: Home / Self Care | Payer: Commercial Managed Care - PPO | Source: Ambulatory Visit | Attending: Obstetrics and Gynecology | Admitting: Obstetrics and Gynecology

## 2019-03-18 DIAGNOSIS — R928 Other abnormal and inconclusive findings on diagnostic imaging of breast: Secondary | ICD-10-CM | POA: Insufficient documentation

## 2019-03-18 DIAGNOSIS — R921 Mammographic calcification found on diagnostic imaging of breast: Secondary | ICD-10-CM | POA: Diagnosis present

## 2019-04-17 ENCOUNTER — Ambulatory Visit: Payer: Commercial Managed Care - PPO | Admitting: Family Medicine

## 2019-04-17 ENCOUNTER — Other Ambulatory Visit: Payer: Self-pay

## 2019-04-17 ENCOUNTER — Encounter: Payer: Self-pay | Admitting: Family Medicine

## 2019-04-17 VITALS — BP 122/66 | HR 72 | Temp 98.9°F | Resp 16 | Ht 66.0 in | Wt 169.0 lb

## 2019-04-17 DIAGNOSIS — K900 Celiac disease: Secondary | ICD-10-CM

## 2019-04-17 DIAGNOSIS — R87612 Low grade squamous intraepithelial lesion on cytologic smear of cervix (LGSIL): Secondary | ICD-10-CM

## 2019-04-17 DIAGNOSIS — I82A12 Acute embolism and thrombosis of left axillary vein: Secondary | ICD-10-CM

## 2019-04-17 DIAGNOSIS — M722 Plantar fascial fibromatosis: Secondary | ICD-10-CM

## 2019-04-17 DIAGNOSIS — Z23 Encounter for immunization: Secondary | ICD-10-CM

## 2019-04-17 DIAGNOSIS — M2550 Pain in unspecified joint: Secondary | ICD-10-CM | POA: Diagnosis not present

## 2019-04-17 NOTE — Patient Instructions (Signed)
Decrease Aspirin to 81mg  daily.  Try Tumeric OTC once daily.

## 2019-04-17 NOTE — Progress Notes (Signed)
Patient: Leslie Jacobs Female    DOB: 07/21/1966   53 y.o.   MRN: [id number] Visit Date: 04/17/2019  Today's Provider: [name], MD   Chief Complaint  Patient presents with  . New Patient (Initial Visit)  . joint pain all over   Subjective:   HPI Patient comes in today to establish care into the practice. She was last seen in the office in 2013. Since then she has been followed by her GYN at [facility]. She reports that she is no longer on HRT.   She comes in today c/o persistent joint pain. She reports that it is mainly in her hands, elbows, and knees. She describes it as a tight, achy sensation that seems to have gotten worse. She does not take anything for her symptoms.    Allergies  Allergen Reactions  . Hydrocodone-Acetaminophen Nausea Only  . Gluten Meal     Other reaction(s): Unknown     Current Outpatient Medications:  .  aspirin EC 325 MG tablet, Take by mouth., Disp: , Rfl:  .  Multiple Vitamin (MULTIVITAMIN) capsule, Take 1 capsule by mouth daily., Disp: , Rfl:  .  norethindrone (MICRONOR,CAMILA,ERRIN) 0.35 MG tablet, Take 1 tablet by mouth daily., Disp: , Rfl:   Current Facility-Administered Medications:  .  triamcinolone acetonide (KENALOG) 10 MG/ML injection 10 mg, 10 mg, Other, Once, Asencion Islam, DPM  Review of Systems  Constitutional: Negative for activity change and fatigue.  Eyes: Negative.   Respiratory: Negative for cough and shortness of breath.   Cardiovascular: Negative for chest pain, palpitations and leg swelling.  Gastrointestinal: Negative.   Endocrine: Negative.   Musculoskeletal: Positive for arthralgias, joint swelling and myalgias. Negative for neck pain and neck stiffness.  Allergic/Immunologic: Negative for environmental allergies.  Neurological: Negative for dizziness, light-headedness and headaches.  Psychiatric/Behavioral: Negative for agitation, self-injury, sleep disturbance and suicidal ideas. The patient  is not nervous/anxious.     Social History   Tobacco Use  . Smoking status: Never Smoker  . Smokeless tobacco: Never Used  Substance Use Topics  . Alcohol use: Never    Frequency: Never      Objective:   BP 122/66   Pulse 72   Temp 98.9 F (37.2 C)   Resp 16   Ht 5\' 6"  (1.676 m)   Wt 169 lb (76.7 kg)   LMP 01/19/2016   SpO2 100%   BMI 27.28 kg/m  Vitals:   04/17/19 1332  BP: 122/66  Pulse: 72  Resp: 16  Temp: 98.9 F (37.2 C)  SpO2: 100%  Weight: 169 lb (76.7 kg)  Height: 5\' 6"  (1.676 m)  Body mass index is 27.28 kg/m.   Physical Exam Vitals signs reviewed.  Constitutional:      Appearance: Normal appearance.  HENT:     Head: Normocephalic and atraumatic.     Right Ear: External ear normal.     Left Ear: External ear normal.  Eyes:     General: No scleral icterus.    Conjunctiva/sclera: Conjunctivae normal.  Cardiovascular:     Rate and Rhythm: Normal rate and regular rhythm.     Pulses: Normal pulses.     Heart sounds: Normal heart sounds.  Pulmonary:     Effort: Pulmonary effort is normal.     Breath sounds: Normal breath sounds.  Abdominal:     Palpations: Abdomen is soft.  Musculoskeletal:     Right lower leg: No edema.  Left lower leg: No edema.  Skin:    General: Skin is warm and dry.  Neurological:     General: No focal deficit present.     Mental Status: She is alert and oriented to person, place, and time.  Psychiatric:        Mood and Affect: Mood normal.        Behavior: Behavior normal.        Thought Content: Thought content normal.        Judgment: Judgment normal.      No results found for any visits on 04/17/19.     Assessment & Plan    1. Flu vaccine need  - Flu Vaccine QUAD 6+ mos PF IM (Fluarix Quad PF)  2. Arthralgia, unspecified joint  - Antinuclear Antib (ANA) - Rheumatoid Factor - Sedimentation rate  3. Celiac disease/sprue   4. Low grade squamous intraepithelial lesion (LGSIL) on Papanicolaou  smear of cervix F/u CPE early 2021.  5. Plantar fasciitis, bilateral   6. 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      Richard Cranford Mon, MD  [facility]

## 2019-04-18 LAB — ANA: Anti Nuclear Antibody (ANA): NEGATIVE

## 2019-04-18 LAB — RHEUMATOID FACTOR: Rhuematoid fact SerPl-aCnc: <10 IU/mL (ref 0.0–13.9)

## 2019-04-18 LAB — SEDIMENTATION RATE: Sed Rate: 7 mm/hr (ref 0–40)

## 2019-04-21 ENCOUNTER — Telehealth: Payer: Self-pay

## 2019-04-21 NOTE — Telephone Encounter (Signed)
-----   Message from Jerrol Banana., MD sent at 04/18/2019  7:24 PM EDT ----- Normal labs.

## 2019-04-21 NOTE — Telephone Encounter (Signed)
Left message to call back regarding lab results.

## 2019-04-24 NOTE — Telephone Encounter (Signed)
Left message for patient to call back regarding lab results.

## 2019-05-30 ENCOUNTER — Telehealth: Payer: Self-pay

## 2019-07-01 ENCOUNTER — Other Ambulatory Visit: Payer: Self-pay

## 2019-07-01 ENCOUNTER — Ambulatory Visit: Payer: Commercial Managed Care - PPO | Admitting: Family Medicine

## 2019-07-01 ENCOUNTER — Encounter: Payer: Self-pay | Admitting: Family Medicine

## 2019-07-01 VITALS — BP 125/74 | HR 85 | Temp 96.9°F | Resp 16 | Ht 66.0 in | Wt 168.0 lb

## 2019-07-01 DIAGNOSIS — Z0000 Encounter for general adult medical examination without abnormal findings: Secondary | ICD-10-CM

## 2019-07-01 DIAGNOSIS — I10 Essential (primary) hypertension: Secondary | ICD-10-CM

## 2019-07-01 DIAGNOSIS — R300 Dysuria: Secondary | ICD-10-CM

## 2019-07-01 LAB — POCT URINALYSIS DIPSTICK
Appearance: ABNORMAL
Bilirubin, UA: NEGATIVE
Glucose, UA: NEGATIVE
Ketones, UA: NEGATIVE
Nitrite, UA: POSITIVE
Odor: ABNORMAL
Protein, UA: NEGATIVE
Spec Grav, UA: 1.02 (ref 1.010–1.025)
Urobilinogen, UA: 0.2 E.U./dL
pH, UA: 6.5 (ref 5.0–8.0)

## 2019-07-01 MED ORDER — NITROFURANTOIN MONOHYD MACRO 100 MG PO CAPS: 100.0000 mg | ORAL_CAPSULE | Freq: Two times a day (BID) | ORAL | 0 refills | Status: DC

## 2019-07-01 NOTE — Progress Notes (Signed)
Patient: Leslie Jacobs Female    DOB: 08-12-1965   53 y.o.   MRN: [id number] Visit Date: 07/01/2019  Today's Provider: [name], MD   No chief complaint on file.  Subjective:     HPI  Allergies  Allergen Reactions  . Hydrocodone-Acetaminophen Nausea Only  . Gluten Meal     Other reaction(s): Unknown     Current Outpatient Medications:  .  aspirin 81 MG EC tablet, Take 81 mg by mouth daily. Swallow whole., Disp: , Rfl:  .  Multiple Vitamin (MULTIVITAMIN) capsule, Take 1 capsule by mouth daily., Disp: , Rfl:  .  TURMERIC PO, Take by mouth daily., Disp: , Rfl:  .  aspirin EC 325 MG tablet, Take by mouth., Disp: , Rfl:  .  nitrofurantoin, macrocrystal-monohydrate, (MACROBID) 100 MG capsule, Take 1 capsule (100 mg total) by mouth 2 (two) times daily., Disp: 6 capsule, Rfl: 0 .  norethindrone (MICRONOR,CAMILA,ERRIN) 0.35 MG tablet, Take 1 tablet by mouth daily., Disp: , Rfl:   Current Facility-Administered Medications:  .  triamcinolone acetonide (KENALOG) 10 MG/ML injection 10 mg, 10 mg, Other, Once, [place], [place], DPM  Review of Systems  Constitutional: Negative for appetite change, chills, fatigue and fever.  Respiratory: Negative for chest tightness and shortness of breath.   Cardiovascular: Negative for chest pain and palpitations.  Gastrointestinal: Negative for abdominal pain, nausea and vomiting.  Genitourinary: Positive for dysuria.  Neurological: Negative for dizziness and weakness.    Social History   Tobacco Use  . Smoking status: Never Smoker  . Smokeless tobacco: Never Used  Substance Use Topics  . Alcohol use: Never    Frequency: Never      Objective:   BP 125/74 (BP Location: Right Arm, Patient Position: Sitting, Cuff Size: Large)   Pulse 85   Temp (!) 96.9 F (36.1 C) (Other (Comment))   Resp 16   Ht 5\' 6"  (1.676 m)   Wt 168 lb (76.2 kg)   LMP 01/19/2016   SpO2 98%   BMI 27.12 kg/m  Vitals:   07/01/19 0840  BP: 125/74   Pulse: 85  Resp: 16  Temp: (!) 96.9 F (36.1 C)  TempSrc: Other (Comment)  SpO2: 98%  Weight: 168 lb (76.2 kg)  Height: 5\' 6"  (1.676 m)  Body mass index is 27.12 kg/m.   Physical Exam Vitals reviewed.  Constitutional:      Appearance: Normal appearance.  HENT:     Head: Normocephalic and atraumatic.  Eyes:     General: No scleral icterus.    Conjunctiva/sclera: Conjunctivae normal.  Cardiovascular:     Rate and Rhythm: Normal rate and regular rhythm.     Heart sounds: Normal heart sounds.  Pulmonary:     Breath sounds: Normal breath sounds.  Abdominal:     Palpations: Abdomen is soft.     Comments: No CVAT.  Skin:    General: Skin is warm and dry.  Neurological:     General: No focal deficit present.     Mental Status: She is alert and oriented to person, place, and time.  Psychiatric:        Mood and Affect: Mood normal.        Behavior: Behavior normal.        Thought Content: Thought content normal.        Judgment: Judgment normal.      Results for orders placed or performed in visit on 07/01/19  POCT urinalysis dipstick  Result Value Ref Range   Color, UA Yellow    Clarity, UA Cloudy    Glucose, UA Negative Negative   Bilirubin, UA Neg    Ketones, UA Neg    Spec Grav, UA 1.020 1.010 - 1.025   Blood, UA Trace Non-Hemolyzed    pH, UA 6.5 5.0 - 8.0   Protein, UA Negative Negative   Urobilinogen, UA 0.2 0.2 or 1.0 E.U./dL   Nitrite, UA Positive    Leukocytes, UA Moderate (2+) (A) Negative   Appearance Abnormal    Odor Abnormal        Assessment & Plan     1. Dysuria UTI - POCT urinalysis dipstick - CULTURE, URINE COMPREHENSIVE - nitrofurantoin, macrocrystal-monohydrate, (MACROBID) 100 MG capsule; Take 1 capsule (100 mg total) by mouth 2 (two) times daily.  Dispense: 6 capsule; Refill: 0  2. Essential hypertension  - CBC w/Diff/Platelet - Lipid panel - TSH - Comprehensive Metabolic Panel (CMET)  3. Annual physical exam Exam not done.   Labs done for wellness for work. - CBC w/Diff/Platelet - Lipid panel - TSH - Comprehensive Metabolic Panel (CMET)      Wilhemena Durie, MD  [facility]

## 2019-07-02 LAB — LIPID PANEL
Chol/HDL Ratio: 3.9 ratio (ref 0.0–4.4)
Cholesterol, Total: 179 mg/dL (ref 100–199)
HDL: 46 mg/dL (ref >39)
LDL Chol Calc (NIH): 114 mg/dL — ABNORMAL HIGH (ref 0–99)
Triglycerides: 104 mg/dL (ref 0–149)
VLDL Cholesterol Cal: 19 mg/dL (ref 5–40)

## 2019-07-02 LAB — COMPREHENSIVE METABOLIC PANEL
ALT: 28 IU/L (ref 0–32)
AST: 19 IU/L (ref 0–40)
Albumin/Globulin Ratio: 2.1 (ref 1.2–2.2)
Albumin: 4.4 g/dL (ref 3.8–4.9)
Alkaline Phosphatase: 102 IU/L (ref 39–117)
BUN/Creatinine Ratio: 21 (ref 9–23)
BUN: 16 mg/dL (ref 6–24)
Bilirubin Total: 0.4 mg/dL (ref 0.0–1.2)
CO2: 23 mmol/L (ref 20–29)
Calcium: 9.8 mg/dL (ref 8.7–10.2)
Chloride: 104 mmol/L (ref 96–106)
Creatinine, Ser: 0.76 mg/dL (ref 0.57–1.00)
GFR calc Af Amer: 102 mL/min/{1.73_m2} (ref >59)
GFR calc non Af Amer: 89 mL/min/{1.73_m2} (ref >59)
Globulin, Total: 2.1 g/dL (ref 1.5–4.5)
Glucose: 82 mg/dL (ref 65–99)
Potassium: 4.4 mmol/L (ref 3.5–5.2)
Sodium: 143 mmol/L (ref 134–144)
Total Protein: 6.5 g/dL (ref 6.0–8.5)

## 2019-07-02 LAB — CBC WITH DIFFERENTIAL/PLATELET
Basophils Absolute: 0 10*3/uL (ref 0.0–0.2)
Basos: 1 %
EOS (ABSOLUTE): 0.1 10*3/uL (ref 0.0–0.4)
Eos: 2 %
Hematocrit: 43.9 % (ref 34.0–46.6)
Hemoglobin: 14.4 g/dL (ref 11.1–15.9)
Immature Grans (Abs): 0 10*3/uL (ref 0.0–0.1)
Immature Granulocytes: 0 %
Lymphocytes Absolute: 1.3 10*3/uL (ref 0.7–3.1)
Lymphs: 24 %
MCH: 30.5 pg (ref 26.6–33.0)
MCHC: 32.8 g/dL (ref 31.5–35.7)
MCV: 93 fL (ref 79–97)
Monocytes Absolute: 0.3 10*3/uL (ref 0.1–0.9)
Monocytes: 6 %
Neutrophils Absolute: 3.6 10*3/uL (ref 1.4–7.0)
Neutrophils: 67 %
Platelets: 203 10*3/uL (ref 150–450)
RBC: 4.72 x10E6/uL (ref 3.77–5.28)
RDW: 12.5 % (ref 11.7–15.4)
WBC: 5.4 10*3/uL (ref 3.4–10.8)

## 2019-07-02 LAB — TSH: TSH: 1.71 u[IU]/mL (ref 0.450–4.500)

## 2019-07-03 ENCOUNTER — Telehealth: Payer: Self-pay

## 2019-07-03 NOTE — Telephone Encounter (Signed)
Patient had dropped off an insurance [employer] Form at her visit and will need it faxed and a copy made for her to pick up once it is finished. She has been notified of her blood work but we are still waiting on her urine culture. She gave verbal understanding,

## 2019-07-04 LAB — CULTURE, URINE COMPREHENSIVE

## 2019-07-16 ENCOUNTER — Telehealth: Payer: Self-pay | Admitting: Family Medicine

## 2019-07-16 DIAGNOSIS — N3090 Cystitis, unspecified without hematuria: Secondary | ICD-10-CM

## 2019-07-16 MED ORDER — NITROFURANTOIN MONOHYD MACRO 100 MG PO CAPS: 100.0000 mg | ORAL_CAPSULE | Freq: Two times a day (BID) | ORAL | 0 refills | Status: AC

## 2019-07-16 NOTE — Telephone Encounter (Signed)
Tried calling pt, no answer and no vm

## 2019-07-16 NOTE — Telephone Encounter (Signed)
Pt would like to know if provider would call her In a 7 day supply of UTI medication instead of 3. Pt says that she is still having symptoms, and has completed a home test that shows that she still have UTI.    CB: [phone]

## 2019-07-16 NOTE — Telephone Encounter (Signed)
Please advise 

## 2019-07-16 NOTE — Telephone Encounter (Signed)
I have sent in 5 days of macrobid that is a treatment dose and was sensitive on the culture. If she still has symptoms, recommend follow up for new sample and culture.

## 2019-07-21 NOTE — Telephone Encounter (Signed)
Patient was advised and states that she picked up the medication on 07/16/2019. Patient states if she is still having symptoms she will return to the office.

## 2019-08-20 ENCOUNTER — Other Ambulatory Visit: Payer: Self-pay | Admitting: Obstetrics and Gynecology

## 2019-08-20 DIAGNOSIS — R921 Mammographic calcification found on diagnostic imaging of breast: Secondary | ICD-10-CM

## 2019-08-29 NOTE — Telephone Encounter (Signed)
Opened in error. KW °

## 2019-09-04 ENCOUNTER — Ambulatory Visit: Payer: Self-pay | Admitting: Family Medicine

## 2019-09-08 DIAGNOSIS — R002 Palpitations: Secondary | ICD-10-CM | POA: Insufficient documentation

## 2019-09-19 ENCOUNTER — Ambulatory Visit
Admission: RE | Admit: 2019-09-19 | Discharge: 2019-09-19 | Disposition: A | Discharge status: Home / Self Care | Payer: Commercial Managed Care - PPO | Source: Ambulatory Visit | Attending: Obstetrics and Gynecology | Admitting: Obstetrics and Gynecology

## 2019-09-19 DIAGNOSIS — R921 Mammographic calcification found on diagnostic imaging of breast: Secondary | ICD-10-CM | POA: Diagnosis present

## 2019-09-22 ENCOUNTER — Other Ambulatory Visit: Payer: Self-pay | Admitting: Obstetrics and Gynecology

## 2019-09-22 DIAGNOSIS — R921 Mammographic calcification found on diagnostic imaging of breast: Secondary | ICD-10-CM

## 2019-11-03 ENCOUNTER — Ambulatory Visit: Payer: Self-pay | Admitting: *Deleted

## 2019-11-03 NOTE — Telephone Encounter (Signed)
Patient has possible spider bite- felt something Friday morning in jeans while driving. Patient states she has had swelling and redness.  Reason for Disposition . [1] Red or very tender (to touch) area AND [2] started over 24 hours after the bite  Answer Assessment - Initial Assessment Questions 1. TYPE of SPIDER: "What type of spider was it?"  (e.g., name, unknown, or brief description)     unknown 2. LOCATION: "Where is the bite located?"      R leg- outside thigh 3. PAIN: "Is there any pain?" If so, ask: "How bad is it?"  (Scale 1-10; or mild, moderate, severe)     Yes- tight and tender 4. SWELLING: "How big is the swelling?" (Inches, cm or compare to coins)      Center is raised 5. ONSET: "When did the bite occur?" (Minutes or hours ago)      Friday 6. TETANUS: "When was the last tetanus booster?"      unsure 7. OTHER SYMPTOMS: "Do you have any other symptoms?"  (e.g., muscle cramps, abdominal pain, change in urine color)     no  Protocols used: SPIDER BITE - [place]-A-AH

## 2019-11-04 ENCOUNTER — Ambulatory Visit: Payer: Commercial Managed Care - PPO | Admitting: Physician Assistant

## 2019-11-04 ENCOUNTER — Encounter: Payer: Self-pay | Admitting: Physician Assistant

## 2019-11-04 ENCOUNTER — Other Ambulatory Visit: Payer: Self-pay

## 2019-11-04 VITALS — BP 133/82 | HR 73 | Temp 96.9°F | Resp 16 | Ht 66.0 in | Wt 172.0 lb

## 2019-11-04 DIAGNOSIS — L989 Disorder of the skin and subcutaneous tissue, unspecified: Secondary | ICD-10-CM | POA: Diagnosis not present

## 2019-11-04 NOTE — Progress Notes (Signed)
    Established patient visit      I,Bretlyn Ward,acting as a scribe for Trey Sailors, PA-C.,have documented all relevant documentation on the behalf of Trey Sailors, PA-C,as directed by  Trey Sailors, PA-C while in the presence of Trey Sailors, PA-C.   Patient: Leslie Jacobs   DOB: [birth year]   54 y.o. Female  MRN: [id number] Visit Date: 11/04/2019  Today's healthcare provider: Trey Sailors, PA-C  Subjective:    Chief Complaint  Patient presents with  . Insect Bite   The patient is a 54 y o female that presents to the clinic for a possible spider bite on her right thigh above her knee. She first noticed it Friday 10/31/19. It is raised and red around the site. It was hot to the touch but she attempted to drain the site 11/03/2019 and was no longer warm to the touch. She is not on any antibiotic. She has tried neosporyn and hydocortisone and calidryl with no relief. She denies fever and chills. She also denies IV drug use.        Medications: Outpatient Medications Prior to Visit  Medication Sig  . aspirin 81 MG EC tablet Take 81 mg by mouth daily. Swallow whole.  Marland Kitchen MAGNESIUM PO Take by mouth.  . Multiple Vitamin (MULTIVITAMIN) capsule Take 1 capsule by mouth daily.  . TURMERIC PO Take by mouth daily.  . norethindrone (MICRONOR,CAMILA,ERRIN) 0.35 MG tablet Take 1 tablet by mouth daily.  . [DISCONTINUED] aspirin EC 325 MG tablet Take by mouth.  . [DISCONTINUED] Turmeric POWD Take by mouth.   Facility-Administered Medications Prior to Visit  Medication Dose Route Frequency Provider  . triamcinolone acetonide (KENALOG) 10 MG/ML injection 10 mg  10 mg Other Once Asencion Islam, DPM    Review of Systems  Constitutional: Positive for fatigue.  HENT: Negative.   Eyes: Negative.   Respiratory: Negative.   Cardiovascular: Negative.   Gastrointestinal: Negative.   Endocrine: Negative.   Genitourinary: Negative.   Musculoskeletal: Negative.   Skin: Positive  for wound.  Allergic/Immunologic: Negative.   Neurological: Negative.   Hematological: Negative.   Psychiatric/Behavioral: Negative.         Objective:    LMP 01/19/2016    Physical Exam Cardiovascular:     Rate and Rhythm: Normal rate.  Pulmonary:     Effort: Pulmonary effort is normal.  Skin:    General: Skin is warm and dry.     Findings: Abscess and erythema present.       Neurological:     Mental Status: She is alert and oriented to person, place, and time. Mental status is at baseline.  Psychiatric:        Mood and Affect: Mood normal.        Behavior: Behavior normal.       No results found for any visits on 11/04/19.    Assessment & Plan:    1. Skin lesion  Resolving. Keep clean and dry. Avoid neosporyn. No abx needed. Counseled return precautions.   F/u PRN      Maryella Shivers  [facility] [phone] (phone) [phone] (fax)  [facility]

## 2019-11-11 ENCOUNTER — Telehealth: Payer: Self-pay

## 2019-11-11 ENCOUNTER — Ambulatory Visit: Payer: Self-pay | Admitting: Family Medicine

## 2019-11-11 NOTE — Telephone Encounter (Signed)
Pt seen 11/04/2019, insect bite. States was advised to CB if worsening, "Told may need antibiotics." Pt reports increased redness, warmth and swelling. States red, warm, swollen  area 2.5 - 3 inches surrounding bite, "Painful and itchy.  Denies fever, no drainage from wound  "Feels hard."  Has applied cold compresses and "Got in hot tub several times for heat."   Pt states has photo if needed.   If appropriate, [facility] is [facility] [address]. [address]# [phone] or [phone]  Reason for Disposition . [1] Red or very tender (to touch) area AND [2] getting larger over 48 hours after the bite    Seen 4/13, worsening  Answer Assessment - Initial Assessment Questions 1. TYPE of INSECT: "What type of insect was it?"      unsure 2. ONSET: "When did you get bitten?"       3. LOCATION: "Where is the insect bite located?"     Right leg 4. REDNESS: "Is the area red or pink?" If so, ask "What size is area of redness?" (inches or cm). "When did the redness start?"     2.5 to 3 inches. Warm to touch 5. PAIN: "Is there any pain?" If so, ask: "How bad is it?"  (Scale 1-10; or mild, moderate, severe)     Painful and itching 6. ITCHING: "Does it itch?" If so, ask: "How bad is the itch?"    - MILD: doesn't interfere with normal activities   - MODERATE-SEVERE: interferes with work, school, sleep, or other activities      moderate 7. SWELLING: "How big is the swelling?" (inches, cm, or compare to coins)     Swollen area around bite 8. OTHER SYMPTOMS: "Do you have any other symptoms?"  (e.g., difficulty breathing, hives)     no 9. PREGNANCY: "Is there any chance you are pregnant?" "When was your last menstrual period?"     *No Answer*  Protocols used: INSECT BITE-A-AH

## 2019-11-11 NOTE — Telephone Encounter (Signed)
Please advise 

## 2019-11-11 NOTE — Telephone Encounter (Signed)
Copied from CRM [id number]. Topic: General - Other >> Nov 11, 2019  4:30 PM Dalphine Handing A wrote: Patient has been triaged and is calling back a  second time and would like to speak with a nurse today as soon as possible. Patients leg is still swelling and it hot to the touch. Please advise

## 2019-11-11 NOTE — Telephone Encounter (Signed)
Yes needs to be re-evaluated. If she would like to make mychart and do video and send picture it can be virtual. If not, more helpful to be in person.

## 2019-11-11 NOTE — Telephone Encounter (Signed)
Please advise? Patient seen by Osvaldo Angst 11/04/2019.

## 2019-11-11 NOTE — Telephone Encounter (Signed)
She will need some sort of appt for this.

## 2019-11-12 ENCOUNTER — Ambulatory Visit (INDEPENDENT_AMBULATORY_CARE_PROVIDER_SITE_OTHER): Payer: Commercial Managed Care - PPO | Admitting: Physician Assistant

## 2019-11-12 ENCOUNTER — Encounter: Payer: Self-pay | Admitting: Physician Assistant

## 2019-11-12 DIAGNOSIS — M79604 Pain in right leg: Secondary | ICD-10-CM

## 2019-11-12 DIAGNOSIS — R21 Rash and other nonspecific skin eruption: Secondary | ICD-10-CM | POA: Diagnosis not present

## 2019-11-12 DIAGNOSIS — W57XXXS Bitten or stung by nonvenomous insect and other nonvenomous arthropods, sequela: Secondary | ICD-10-CM | POA: Diagnosis not present

## 2019-11-12 MED ORDER — DOXYCYCLINE HYCLATE 100 MG PO TABS: 100.0000 mg | ORAL_TABLET | Freq: Two times a day (BID) | ORAL | 0 refills | Status: AC

## 2019-11-12 NOTE — Telephone Encounter (Signed)
Contacted patient. She has been scheduled for a telephone visit today. She is out of town and will set up her mychart to attach a Building services engineer. She had hoped she would have been called yesterday and someone let her know whether she could be seen at the clinic or if she needed to go to a walk in clinic.

## 2019-11-12 NOTE — Progress Notes (Signed)
Virtual telephone visit    Virtual Visit via Telephone Note   This visit type was conducted due to national recommendations for restrictions regarding the COVID-19 Pandemic (e.g. social distancing) in an effort to limit this patient's exposure and mitigate transmission in our community. Due to her co-morbid illnesses, this patient is at least at moderate risk for complications without adequate follow up. This format is felt to be most appropriate for this patient at this time. The patient did not have access to video technology or had technical difficulties with video requiring transitioning to audio format only (telephone). Physical exam was limited to content and character of the telephone converstion.    Patient location: Home Provider location: Office    Patient: Leslie Jacobs   DOB: 1966-05-04   54 y.o. Female  MRN: [id number] Visit Date: 11/12/2019  Today's Provider: Trey Sailors, PA-C  Subjective:    Leslie Jacobs,acting as a scribe for Trey Sailors, PA-C.,have documented all relevant documentation on the behalf of Trey Sailors, PA-C,as directed by  Trey Sailors, PA-C while in the presence of Trey Sailors, PA-C.  Chief Complaint  Patient presents with  . Leg Swelling  . Leg Pain   Leg Pain  The incident occurred more than 1 week ago. There was no injury mechanism. The pain is present in the right leg. The quality of the pain is described as aching. The pain is at a severity of 5/10. The pain is mild. Associated symptoms include an inability to bear weight. Pertinent negatives include no loss of sensation, numbness or tingling. The symptoms are aggravated by weight bearing and movement. She has tried heat and elevation for the symptoms. The treatment provided mild relief.  Patient states that she took amoxicillin that she had left over from a previous infection. Patient was seen one week ago when lesion was smaller and just looked scabbed.    Media  Information    Document Information  Patient Upload:  Patient Entered Attachment  Image 1  11/12/2019  Attached To:  Patient Message on 11/12/19 with Trey Sailors, PA-C  Source Information  Mychart, Generic       Medications: Outpatient Medications Prior to Visit  Medication Sig  . aspirin 81 MG EC tablet Take 81 mg by mouth daily. Swallow whole.  Marland Kitchen MAGNESIUM PO Take by mouth.  . Multiple Vitamin (MULTIVITAMIN) capsule Take 1 capsule by mouth daily.  . norethindrone (MICRONOR,CAMILA,ERRIN) 0.35 MG tablet Take 1 tablet by mouth daily.  . TURMERIC PO Take by mouth daily.   Facility-Administered Medications Prior to Visit  Medication Dose Route Frequency Provider  . triamcinolone acetonide (KENALOG) 10 MG/ML injection 10 mg  10 mg Other Once Asencion Islam, DPM    Review of Systems  Constitutional: Negative.   Respiratory: Negative.   Cardiovascular: Positive for leg swelling (w/pain per pt).  Skin: Positive for wound. Negative for rash.  Neurological: Negative for tingling and numbness.        Objective:    LMP 01/19/2016        Assessment & Plan:    1. Rash  Will treat for lyme disease as she has expanding targetoid rash. If not healing, possibly ringworm and can use lamisil or lotrimin twice daily for two weeks.   - doxycycline (VIBRA-TABS) 100 MG tablet; Take 1 tablet (100 mg total) by mouth 2 (two) times daily for 10 days.  Dispense: 20 tablet; Refill: 0  2. Tick bite, sequela  -  doxycycline (VIBRA-TABS) 100 MG tablet; Take 1 tablet (100 mg total) by mouth 2 (two) times daily for 10 days.  Dispense: 20 tablet; Refill: 0   F/u PRN  I discussed the assessment and treatment plan with the patient. The patient was provided an opportunity to ask questions and all were answered. The patient agreed with the plan and demonstrated an understanding of the instructions.   The patient was advised to call back or seek an in-person evaluation if the symptoms  worsen or if the condition fails to improve as anticipated.  I provided 15 minutes of non-face-to-face time during this encounter.  ITrinna Post, PA-C, have reviewed all documentation for this visit. The documentation on 11/12/19 for the exam, diagnosis, procedures, and orders are all accurate and complete.   Paul[facility] [phone] (phone) [phone] (fax)  [place]

## 2020-02-16 ENCOUNTER — Other Ambulatory Visit: Payer: Self-pay

## 2020-02-16 ENCOUNTER — Ambulatory Visit: Payer: Commercial Managed Care - PPO | Admitting: Family Medicine

## 2020-02-16 ENCOUNTER — Encounter: Payer: Self-pay | Admitting: Family Medicine

## 2020-02-16 VITALS — BP 121/76 | HR 82 | Temp 97.5°F | Wt 177.8 lb

## 2020-02-16 DIAGNOSIS — W57XXXS Bitten or stung by nonvenomous insect and other nonvenomous arthropods, sequela: Secondary | ICD-10-CM | POA: Diagnosis not present

## 2020-02-16 DIAGNOSIS — L539 Erythematous condition, unspecified: Secondary | ICD-10-CM

## 2020-02-16 MED ORDER — DOXYCYCLINE HYCLATE 100 MG PO TABS: 100.0000 mg | ORAL_TABLET | Freq: Two times a day (BID) | ORAL | 0 refills | Status: DC

## 2020-02-16 NOTE — Progress Notes (Signed)
Established patient visit   Patient: Leslie Jacobs   DOB: 1965/12/16   54 y.o. Female  MRN: [id number] Visit Date: 02/16/2020  Today's healthcare provider: [name], MD   Chief Complaint  Patient presents with  . Insect Bite   Subjective    HPI   Patient presents today in office for a tick bite on her right lower back. Patient first noticed it last Monday night when her husband removed it for her. Patient says that is still itchy and red. Patient took a picture Saturday when it was really red and says that she thinks it may be a little better today.  No fevers or headaches or myalgias or any other systemic symptoms. The pictures she brings in from her phone looks like a target lesion.      Medications: Outpatient Medications Prior to Visit  Medication Sig  . aspirin 81 MG EC tablet Take 81 mg by mouth daily. Swallow whole.  Marland Kitchen MAGNESIUM PO Take by mouth.  . Multiple Vitamin (MULTIVITAMIN) capsule Take 1 capsule by mouth daily.  . TURMERIC PO Take by mouth daily.  . norethindrone (MICRONOR,CAMILA,ERRIN) 0.35 MG tablet Take 1 tablet by mouth daily. (Patient not taking: Reported on 02/16/2020)   Facility-Administered Medications Prior to Visit  Medication Dose Route Frequency Provider  . triamcinolone acetonide (KENALOG) 10 MG/ML injection 10 mg  10 mg Other Once Asencion Islam, DPM    Review of Systems  All other systems reviewed and are negative.      Objective    BP 121/76 (BP Location: Right Arm, Patient Position: Sitting, Cuff Size: Normal)   Pulse 82   Temp (!) 97.5 F (36.4 C) (Temporal)   Wt 177 lb 12.8 oz (80.6 kg)   LMP 01/19/2016   BMI 28.70 kg/m     Physical Exam Vitals reviewed.  Constitutional:      Appearance: Normal appearance.  HENT:     Head: Normocephalic and atraumatic.     Right Ear: External ear normal.     Left Ear: External ear normal.  Eyes:     General: No scleral icterus.    Conjunctiva/sclera: Conjunctivae normal.   Cardiovascular:     Rate and Rhythm: Normal rate and regular rhythm.     Pulses: Normal pulses.     Heart sounds: Normal heart sounds.  Pulmonary:     Effort: Pulmonary effort is normal.     Breath sounds: Normal breath sounds.  Musculoskeletal:     Right lower leg: No edema.     Left lower leg: No edema.  Skin:    General: Skin is warm and dry.     Comments: She has an olive size mildly indurated erythematous area in the right lower back where the tick was removed.  No drainage.  No tenderness.  Neurological:     General: No focal deficit present.     Mental Status: She is alert and oriented to person, place, and time.  Psychiatric:        Mood and Affect: Mood normal.        Behavior: Behavior normal.        Thought Content: Thought content normal.        Judgment: Judgment normal.      No results found for any visits on 02/16/20.  Assessment & Plan     1. Tick bite, sequela Treat for 5 days although I think risk of any infection is low. - doxycycline (VIBRA-TABS) 100  MG tablet; Take 1 tablet (100 mg total) by mouth 2 (two) times daily.  Dispense: 20 tablet; Refill: 0   Return if symptoms worsen or fail to improve.        Somalia Segler Wendelyn Breslow, MD  [facility] [phone] (phone) [phone] (fax)  [facility]

## 2020-03-05 ENCOUNTER — Ambulatory Visit
Admission: RE | Admit: 2020-03-05 | Discharge: 2020-03-05 | Disposition: A | Discharge status: Home / Self Care | Payer: Commercial Managed Care - PPO | Source: Ambulatory Visit | Attending: Obstetrics and Gynecology | Admitting: Obstetrics and Gynecology

## 2020-03-05 ENCOUNTER — Other Ambulatory Visit: Payer: Self-pay

## 2020-03-05 DIAGNOSIS — R921 Mammographic calcification found on diagnostic imaging of breast: Secondary | ICD-10-CM | POA: Diagnosis present

## 2020-04-01 NOTE — Progress Notes (Signed)
Established patient visit   Patient: Leslie Jacobs   DOB: Mar 16, 1966   54 y.o. Female  MRN: [id number] Visit Date: 04/02/2020  Today's healthcare provider: Trey Sailors, PA-C   Chief Complaint  Patient presents with  . Ear Fullness  I,Porsha C McClurkin,acting as a scribe for Trey Sailors, PA-C.,have documented all relevant documentation on the behalf of Trey Sailors, PA-C,as directed by  Trey Sailors, PA-C while in the presence of Trey Sailors, PA-C.  Subjective    Ear Fullness  There is pain in the left ear. This is a new problem. The current episode started 1 to 4 weeks ago. The problem occurs constantly. The problem has been unchanged. There has been no fever. The fever has been present for less than 1 day. The pain is at a severity of 0/10. The patient is experiencing no pain. Associated symptoms include hearing loss (left ear only per pt). Pertinent negatives include no coughing, ear discharge, headaches, rhinorrhea or sore throat. She has tried nothing for the symptoms. The treatment provided no relief.  Patient denies using Q-Tips.     Medications: Outpatient Medications Prior to Visit  Medication Sig  . aspirin 81 MG EC tablet Take 81 mg by mouth daily. Swallow whole.  Marland Kitchen MAGNESIUM PO Take by mouth.  . Multiple Vitamin (MULTIVITAMIN) capsule Take 1 capsule by mouth daily.  . TURMERIC PO Take by mouth daily.  Marland Kitchen doxycycline (VIBRA-TABS) 100 MG tablet Take 1 tablet (100 mg total) by mouth 2 (two) times daily. (Patient not taking: Reported on 04/02/2020)  . norethindrone (MICRONOR,CAMILA,ERRIN) 0.35 MG tablet Take 1 tablet by mouth daily. (Patient not taking: Reported on 02/16/2020)   Facility-Administered Medications Prior to Visit  Medication Dose Route Frequency Provider  . triamcinolone acetonide (KENALOG) 10 MG/ML injection 10 mg  10 mg Other Once Asencion Islam, DPM    Review of Systems  HENT: Positive for hearing loss (left ear only per pt).  Negative for ear discharge, rhinorrhea and sore throat.   Respiratory: Negative for cough.   Neurological: Negative for headaches.      Objective    BP 122/72 (BP Location: Left Arm, Patient Position: Sitting, Cuff Size: Normal)   Pulse 83   Temp 99 F (37.2 C) (Oral)   Wt 177 lb 12.8 oz (80.6 kg)   LMP 01/19/2016   SpO2 98%   BMI 28.70 kg/m    Physical Exam Constitutional:      Appearance: Normal appearance.  HENT:     Right Ear: Tympanic membrane, ear canal and external ear normal.     Left Ear: Tympanic membrane, ear canal and external ear normal.  Skin:    General: Skin is warm and dry.  Neurological:     General: No focal deficit present.     Mental Status: She is alert and oriented to person, place, and time.  Psychiatric:        Mood and Affect: Mood normal.        Behavior: Behavior normal.       No results found for any visits on 04/02/20.  Assessment & Plan    1. Ear fullness, left  Counseled on using 2nd gen antihistamine and flonase. If flonase not effective, may consider dymista.   2. Dysfunction of left eustachian tube     No follow-ups on file.      ITrey Sailors, PA-C, have reviewed all documentation for this visit. The documentation on 04/05/20  for the exam, diagnosis, procedures, and orders are all accurate and complete.  The entirety of the information documented in the History of Present Illness, Review of Systems and Physical Exam were personally obtained by me. Portions of this information were initially documented by [facility] and reviewed by me for thoroughness and accuracy.     Maryella Shivers  [facility] [phone] (phone) [phone] (fax)  [facility]

## 2020-04-02 ENCOUNTER — Encounter: Payer: Self-pay | Admitting: Physician Assistant

## 2020-04-02 ENCOUNTER — Other Ambulatory Visit: Payer: Self-pay

## 2020-04-02 ENCOUNTER — Ambulatory Visit: Payer: Commercial Managed Care - PPO | Admitting: Physician Assistant

## 2020-04-02 VITALS — BP 122/72 | HR 83 | Temp 99.0°F | Wt 177.8 lb

## 2020-04-02 DIAGNOSIS — H6982 Other specified disorders of Eustachian tube, left ear: Secondary | ICD-10-CM | POA: Diagnosis not present

## 2020-04-02 DIAGNOSIS — H938X2 Other specified disorders of left ear: Secondary | ICD-10-CM

## 2020-04-02 DIAGNOSIS — H6992 Unspecified Eustachian tube disorder, left ear: Secondary | ICD-10-CM

## 2020-04-02 NOTE — Patient Instructions (Addendum)
Add flonase one spray each nostril twice daily - in addition to xyzal Sudafed - for three days   Eustachian Tube Dysfunction  Eustachian tube dysfunction refers to a condition in which a blockage develops in the narrow passage that connects the middle ear to the back of the nose (eustachian tube). The eustachian tube regulates air pressure in the middle ear by letting air move between the ear and nose. It also helps to drain fluid from the middle ear space. Eustachian tube dysfunction can affect one or both ears. When the eustachian tube does not function properly, air pressure, fluid, or both can build up in the middle ear. What are the causes? This condition occurs when the eustachian tube becomes blocked or cannot open normally. Common causes of this condition include:  Ear infections.  Colds and other infections that affect the nose, mouth, and throat (upper respiratory tract).  Allergies.  Irritation from cigarette smoke.  Irritation from stomach acid coming up into the esophagus (gastroesophageal reflux). The esophagus is the tube that carries food from the mouth to the stomach.  Sudden changes in air pressure, such as from descending in an airplane or scuba diving.  Abnormal growths in the nose or throat, such as: ? Growths that line the nose (nasal polyps). ? Abnormal growth of cells (tumors). ? Enlarged tissue at the back of the throat (adenoids). What increases the risk? You are more likely to develop this condition if:  You smoke.  You are overweight.  You are a child who has: ? Certain birth defects of the mouth, such as cleft palate. ? Large tonsils or adenoids. What are the signs or symptoms? Common symptoms of this condition include:  A feeling of fullness in the ear.  Ear pain.  Clicking or popping noises in the ear.  Ringing in the ear.  Hearing loss.  Loss of balance.  Dizziness. Symptoms may get worse when the air pressure around you changes,  such as when you travel to an area of high elevation, fly on an airplane, or go scuba diving. How is this diagnosed? This condition may be diagnosed based on:  Your symptoms.  A physical exam of your ears, nose, and throat.  Tests, such as those that measure: ? The movement of your eardrum (tympanogram). ? Your hearing (audiometry). How is this treated? Treatment depends on the cause and severity of your condition.  In mild cases, you may relieve your symptoms by moving air into your ears. This is called "popping the ears."  In more severe cases, or if you have symptoms of fluid in your ears, treatment may include: ? Medicines to relieve congestion (decongestants). ? Medicines that treat allergies (antihistamines). ? Nasal sprays or ear drops that contain medicines that reduce swelling (steroids). ? A procedure to drain the fluid in your eardrum (myringotomy). In this procedure, a small tube is placed in the eardrum to:  Drain the fluid.  Restore the air in the middle ear space. ? A procedure to insert a balloon device through the nose to inflate the opening of the eustachian tube (balloon dilation). Follow these instructions at home: Lifestyle  Do not do any of the following until your health care provider approves: ? Travel to high altitudes. ? Fly in airplanes. ? Work in a Estate agent or room. ? Scuba dive.  Do not use any products that contain nicotine or tobacco, such as cigarettes and e-cigarettes. If you need help quitting, ask your health care provider.  Keep  your ears dry. Wear fitted earplugs during showering and bathing. Dry your ears completely after. General instructions  Take over-the-counter and prescription medicines only as told by your health care provider.  Use techniques to help pop your ears as recommended by your health care provider. These may include: ? Chewing gum. ? Yawning. ? Frequent, forceful swallowing. ? Closing your mouth, holding  your nose closed, and gently blowing as if you are trying to blow air out of your nose.  Keep all follow-up visits as told by your health care provider. This is important. Contact a health care provider if:  Your symptoms do not go away after treatment.  Your symptoms come back after treatment.  You are unable to pop your ears.  You have: ? A fever. ? Pain in your ear. ? Pain in your head or neck. ? Fluid draining from your ear.  Your hearing suddenly changes.  You become very dizzy.  You lose your balance. Summary  Eustachian tube dysfunction refers to a condition in which a blockage develops in the eustachian tube.  It can be caused by ear infections, allergies, inhaled irritants, or abnormal growths in the nose or throat.  Symptoms include ear pain, hearing loss, or ringing in the ears.  Mild cases are treated with maneuvers to unblock the ears, such as yawning or ear popping.  Severe cases are treated with medicines. Surgery may also be done (rare). This information is not intended to replace advice given to you by your health care provider. Make sure you discuss any questions you have with your health care provider. Document Revised: 10/30/2017 Document Reviewed: 10/30/2017 Elsevier Patient Education  2020 [employer].

## 2020-04-30 ENCOUNTER — Other Ambulatory Visit: Payer: Self-pay

## 2020-04-30 ENCOUNTER — Encounter: Payer: Self-pay | Admitting: Physician Assistant

## 2020-04-30 ENCOUNTER — Ambulatory Visit (INDEPENDENT_AMBULATORY_CARE_PROVIDER_SITE_OTHER): Payer: Commercial Managed Care - PPO | Admitting: Physician Assistant

## 2020-04-30 VITALS — BP 129/79 | HR 80 | Temp 99.1°F | Resp 16 | Ht 66.0 in | Wt 176.0 lb

## 2020-04-30 DIAGNOSIS — L03211 Cellulitis of face: Secondary | ICD-10-CM | POA: Diagnosis not present

## 2020-04-30 MED ORDER — CIPROFLOXACIN HCL 750 MG PO TABS
750.0000 mg | ORAL_TABLET | Freq: Two times a day (BID) | ORAL | 0 refills | Status: DC
Start: 2020-04-30 — End: 2020-05-06

## 2020-04-30 NOTE — Progress Notes (Signed)
Established patient visit   Patient: Leslie Jacobs   DOB: 1965/08/26   54 y.o. Female  MRN: [id number] Visit Date: 04/30/2020  Today's healthcare provider: Trey Sailors, PA-C   Chief Complaint  Patient presents with  . Ear Pain   Subjective    Otalgia  There is pain in both ears. The current episode started in the past 7 days (about 3 days). The problem occurs constantly. The problem has been gradually worsening. There has been no fever. The pain is moderate. Pertinent negatives include no headaches or sore throat. She has tried nothing for the symptoms.    Reports the swelling initially started in her left ear but as since resolved. She now has swelling in her right ear and behind her ear as well. She denies recent ear piercings, trauma, insect bites. She denies fevers, chills, nausea, vomiting.      Medications: Outpatient Medications Prior to Visit  Medication Sig  . aspirin 81 MG EC tablet Take 81 mg by mouth daily. Swallow whole.  Marland Kitchen MAGNESIUM PO Take by mouth.  . Multiple Vitamin (MULTIVITAMIN) capsule Take 1 capsule by mouth daily.  Marland Kitchen doxycycline (VIBRA-TABS) 100 MG tablet Take 1 tablet (100 mg total) by mouth 2 (two) times daily. (Patient not taking: Reported on 04/02/2020)  . norethindrone (MICRONOR,CAMILA,ERRIN) 0.35 MG tablet Take 1 tablet by mouth daily. (Patient not taking: Reported on 02/16/2020)  . TURMERIC PO Take by mouth daily.   Facility-Administered Medications Prior to Visit  Medication Dose Route Frequency Provider  . triamcinolone acetonide (KENALOG) 10 MG/ML injection 10 mg  10 mg Other Once Asencion Islam, DPM    Review of Systems  HENT: Positive for ear pain. Negative for sore throat.   Neurological: Negative for headaches.      Objective    BP 129/79 (BP Location: Right Arm)   Pulse 80   Temp 99.1 F (37.3 C)   Resp 16   Ht 5\' 6"  (1.676 m)   Wt 176 lb (79.8 kg)   LMP 01/19/2016   BMI 28.41 kg/m    Physical  Exam Constitutional:      Appearance: Normal appearance.  HENT:     Right Ear: Tympanic membrane and ear canal normal. Swelling and tenderness present. There is mastoid tenderness.     Left Ear: Tympanic membrane and ear canal normal. Swelling and tenderness present. No mastoid tenderness.     Ears:     Comments: Mild tenderness to palpation along right mastoid.  Neurological:     Mental Status: She is alert.     Media Information   Document Information  Photos  Right ear pain and swelling   04/30/2020 15:15  Attached To:  Office Visit on 04/30/20 with 06/30/20, PA-C  Source Information  Trey Sailors M, PA-C  [facility]     No results found for any visits on 04/30/20.  Assessment & Plan     1. Preauricular cellulitis  Patient does not appear toxic. Counseled patient about strict return precautions regarding mastoiditis. Patient understands, will start as below and follow up next week with PCP.  - ciprofloxacin (CIPRO) 750 MG tablet; Take 1 tablet (750 mg total) by mouth 2 (two) times daily for 7 days.  Dispense: 14 tablet; Refill: 0    I, 06/30/20, PA-C, have reviewed all documentation for this visit. The documentation on 05/06/20 for the exam, diagnosis, procedures, and orders are all accurate and complete.  The entirety  of the information documented in the History of Present Illness, Review of Systems and Physical Exam were personally obtained by me. Portions of this information were initially documented by Anson [place], CMA and reviewed by me for thoroughness and accuracy.     Maryella Shivers  [facility] [phone] (phone) [phone] (fax)  [facility]

## 2020-05-02 ENCOUNTER — Encounter: Payer: Self-pay | Admitting: Emergency Medicine

## 2020-05-02 ENCOUNTER — Other Ambulatory Visit: Payer: Self-pay

## 2020-05-02 ENCOUNTER — Emergency Department
Admission: EM | Admit: 2020-05-02 | Discharge: 2020-05-02 | Disposition: A | Discharge status: Home / Self Care | Payer: Commercial Managed Care - PPO | Attending: Emergency Medicine | Admitting: Emergency Medicine

## 2020-05-02 DIAGNOSIS — Z7982 Long term (current) use of aspirin: Secondary | ICD-10-CM | POA: Insufficient documentation

## 2020-05-02 DIAGNOSIS — L519 Erythema multiforme, unspecified: Secondary | ICD-10-CM | POA: Insufficient documentation

## 2020-05-02 DIAGNOSIS — I10 Essential (primary) hypertension: Secondary | ICD-10-CM | POA: Insufficient documentation

## 2020-05-02 DIAGNOSIS — L508 Other urticaria: Secondary | ICD-10-CM

## 2020-05-02 DIAGNOSIS — H9392 Unspecified disorder of left ear: Secondary | ICD-10-CM | POA: Diagnosis present

## 2020-05-02 LAB — COMPREHENSIVE METABOLIC PANEL
ALT: 23 U/L (ref 0–44)
AST: 23 U/L (ref 15–41)
Albumin: 4.5 g/dL (ref 3.5–5.0)
Alkaline Phosphatase: 87 U/L (ref 38–126)
Anion gap: 11 (ref 5–15)
BUN: 14 mg/dL (ref 6–20)
CO2: 28 mmol/L (ref 22–32)
Calcium: 10.2 mg/dL (ref 8.9–10.3)
Chloride: 101 mmol/L (ref 98–111)
Creatinine, Ser: 0.77 mg/dL (ref 0.44–1.00)
GFR, Estimated: >60 mL/min (ref >60)
Glucose, Bld: 96 mg/dL (ref 70–99)
Potassium: 4.3 mmol/L (ref 3.5–5.1)
Sodium: 140 mmol/L (ref 135–145)
Total Bilirubin: 0.4 mg/dL (ref 0.3–1.2)
Total Protein: 7.8 g/dL (ref 6.5–8.1)

## 2020-05-02 LAB — CBC WITH DIFFERENTIAL/PLATELET
Abs Immature Granulocytes: 0.02 10*3/uL (ref 0.00–0.07)
Basophils Absolute: 0 10*3/uL (ref 0.0–0.1)
Basophils Relative: 0 %
Eosinophils Absolute: 0.1 10*3/uL (ref 0.0–0.5)
Eosinophils Relative: 1 %
HCT: 44 % (ref 36.0–46.0)
Hemoglobin: 15.3 g/dL — ABNORMAL HIGH (ref 12.0–15.0)
Immature Granulocytes: 0 %
Lymphocytes Relative: 25 %
Lymphs Abs: 1.7 10*3/uL (ref 0.7–4.0)
MCH: 30.8 pg (ref 26.0–34.0)
MCHC: 34.8 g/dL (ref 30.0–36.0)
MCV: 88.5 fL (ref 80.0–100.0)
Monocytes Absolute: 0.4 10*3/uL (ref 0.1–1.0)
Monocytes Relative: 6 %
Neutro Abs: 4.6 10*3/uL (ref 1.7–7.7)
Neutrophils Relative %: 68 %
Platelets: 228 10*3/uL (ref 150–400)
RBC: 4.97 MIL/uL (ref 3.87–5.11)
RDW: 12.3 % (ref 11.5–15.5)
WBC: 6.9 10*3/uL (ref 4.0–10.5)
nRBC: 0 % (ref 0.0–0.2)

## 2020-05-02 LAB — SEDIMENTATION RATE: Sed Rate: 8 mm/hr (ref 0–30)

## 2020-05-02 MED ORDER — LORATADINE 10 MG PO TABS: 10.0000 mg | ORAL_TABLET | Freq: Every day | ORAL | 0 refills | Status: DC | PRN

## 2020-05-02 MED ORDER — PREDNISONE 50 MG PO TABS: 50.0000 mg | ORAL_TABLET | Freq: Every day | ORAL | 0 refills | Status: DC

## 2020-05-02 MED ORDER — PREDNISONE 20 MG PO TABS
60.0000 mg | ORAL_TABLET | Freq: Once | ORAL | Status: AC
  Administered 2020-05-02: 60 mg via ORAL
  Filled 2020-05-02: qty 3

## 2020-05-02 MED ORDER — LORATADINE 10 MG PO TABS
10.0000 mg | ORAL_TABLET | Freq: Once | ORAL | Status: AC
  Administered 2020-05-02: 10 mg via ORAL
  Filled 2020-05-02: qty 1

## 2020-05-02 NOTE — ED Triage Notes (Signed)
Pt presents to ED via POV, states last week started having swelling to L ear, progressed to swelling to R ear. Pt states was seen by PCP yesterday and started on 750 mg Cipro BID for poss pseudomonas. Pt presents today with large red patches to R fore arm, L knee, and R shin. Pt states has been treated twice this year for Lyme disease. VSS in triage.

## 2020-05-02 NOTE — ED Notes (Signed)
Unable to obtain signature due to pad unresponsive. Pt acknowledges discharge instructions, has no further questions, agreeable to discharge

## 2020-05-02 NOTE — ED Notes (Addendum)
Pt reports she recently did some travelling last week. Pt reports after she got back, she developed a "knot" behind her left ear, which began to swell. Pt reports she saw her PCP who prescribed her Cipro (pt has not been on Cipro previously). Pt reports her first dose was Friday night, with rash and increased swelling on Saturday.   Pt with rash to left forehead, behind left ear, right forearm, left buttocks, left knee. Pt with mild swelling to both ears  Pt reports she had two outbreaks of lyme disease this year. Has been using hydrocortisone cream at home. Reports pain and intense itching

## 2020-05-02 NOTE — ED Provider Notes (Signed)
[facility] Emergency Department Provider Note   ____________________________________________    I have reviewed the triage vital signs and the nursing notes.   HISTORY  Chief Complaint Rash, itching    HPI Leslie Jacobs is a 54 y.o. female who reports she had swelling and itching to her left ear, followed by her right ear as well as her scalp.  She developed a rash to her forearm as well as to her right leg.  She reports all of these rashes or itching quite significantly and are erythematous.  She has never had this before.  Denies fevers or chills.  Was put on antibiotics by her PCP.  No ear pain.  Swelling has decreased.  1 month ago had hives on her abdomen for the first time  History reviewed. No pertinent past medical history.  Patient Active Problem List   Diagnosis Date Noted  . Palpitations 09/08/2019  . Strain of left elbow 04/26/2018  . Celiac disease/sprue 05/18/2017  . Essential hypertension 05/18/2017  . Low grade squamous intraepithelial lesion (LGSIL) on Papanicolaou smear of cervix 01/20/2015    History reviewed. No pertinent surgical history.  Prior to Admission medications   Medication Sig Start Date End Date Taking? Authorizing Provider  aspirin 81 MG EC tablet Take 81 mg by mouth daily. Swallow whole.    [provider]  ciprofloxacin (CIPRO) 750 MG tablet Take 1 tablet (750 mg total) by mouth 2 (two) times daily for 7 days. 04/30/20 05/07/20  Trinna Post, PA-C  doxycycline (VIBRA-TABS) 100 MG tablet Take 1 tablet (100 mg total) by mouth 2 (two) times daily. Patient not taking: Reported on 04/02/2020 02/16/20   Jerrol Banana., MD  loratadine (CLARITIN) 10 MG tablet Take 1 tablet (10 mg total) by mouth daily as needed for allergies or itching. 05/02/20 05/02/21  Lavonia Drafts, MD  MAGNESIUM PO Take by mouth.    [provider]  Multiple Vitamin (MULTIVITAMIN) capsule Take 1 capsule by mouth daily.     [provider]  norethindrone (MICRONOR,CAMILA,ERRIN) 0.35 MG tablet Take 1 tablet by mouth daily. Patient not taking: Reported on 02/16/2020    [provider]  predniSONE (DELTASONE) 50 MG tablet Take 1 tablet (50 mg total) by mouth daily with breakfast. 05/02/20   Lavonia Drafts, MD  TURMERIC PO Take by mouth daily.    [provider]     Allergies Hydrocodone-acetaminophen and Gluten meal  Family History  Problem Relation Age of Onset  . Diabetes Mother   . Stroke Mother   . Heart disease Mother   . Hypertension Mother   . COPD Father   . Hypertension Father   . Diabetes Maternal Grandmother   . Stroke Maternal Grandmother   . Cancer Maternal Grandfather   . Breast cancer Neg Hx     Social History Social History   Tobacco Use  . Smoking status: Never Smoker  . Smokeless tobacco: Never Used  Substance Use Topics  . Alcohol use: Never  . Drug use: Never    Review of Systems  Constitutional: No fever/chills  ENT: No throat swelling   Gastrointestinal: No abdominal pain.  No nausea, no vomiting.   Genitourinary: Negative for dysuria. Musculoskeletal: Negative for joint pain Skin: As above Neurological: Negative for headaches     ____________________________________________   PHYSICAL EXAM:  VITAL SIGNS: ED Triage Vitals  Enc Vitals Group     BP 05/02/20 1405 (!) 165/78  Pulse Rate 05/02/20 1405 80     Resp 05/02/20 1405 18     Temp 05/02/20 1405 98.9 F (37.2 C)     Temp Source 05/02/20 1405 Oral     SpO2 05/02/20 1405 100 %     Weight 05/02/20 1406 79.8 kg (176 lb)     Height 05/02/20 1406 1.676 m (5\' 6" )     Head Circumference --      Peak Flow --      Pain Score 05/02/20 1405 8     Pain Loc --      Pain Edu? --      Excl. in [place]? --      Constitutional: Alert and oriented. No acute distress. Pleasant and interactive Eyes: Conjunctivae are normal.  Head: Erythematous rash to the scalp, targetoid lesions,  possible urticaria Nose: No congestion/rhinnorhea. Mouth/Throat: Mucous membranes are moist.  Pharynx normal Cardiovascular: Normal rate, regular rhythm.  Respiratory: Normal respiratory effort.  No retractions. Genitourinary: deferred Musculoskeletal: No joint swelling Neurologic:  Normal speech and language. No gross focal neurologic deficits are appreciated.   Skin:  Skin is warm, dry and intact.  Erythematous urticarial-like rash to the scalp, right arm leg   ____________________________________________   LABS (all labs ordered are listed, but only abnormal results are displayed)  Labs Reviewed  CBC WITH DIFFERENTIAL/PLATELET - Abnormal; Notable for the following components:      Result Value   Hemoglobin 15.3 (*)    All other components within normal limits  COMPREHENSIVE METABOLIC PANEL  SEDIMENTATION RATE   ____________________________________________  EKG   ____________________________________________  RADIOLOGY   ____________________________________________   PROCEDURES  Procedure(s) performed: No  Procedures   Critical Care performed: No ____________________________________________   INITIAL IMPRESSION / ASSESSMENT AND PLAN / ED COURSE  Pertinent labs & imaging results that were available during my care of the patient were reviewed by me and considered in my medical decision making (see chart for details).  Patient's presentation most consistent with allergic reaction, rash appears urticarial in nature, differential includes erythema multiforme, not consistent with migrans and she reports he has been treated for Lyme disease in the past but no tick bites, no recent travel, no outdoor activities.  We will start patient on prednisone, Benadryl outpatient follow-up   ____________________________________________   FINAL CLINICAL IMPRESSION(S) / ED DIAGNOSES  Final diagnoses:  Urticaria multiforme      NEW MEDICATIONS STARTED DURING THIS  VISIT:  Discharge Medication List as of 05/02/2020  7:17 PM    START taking these medications   Details  loratadine (CLARITIN) 10 MG tablet Take 1 tablet (10 mg total) by mouth daily as needed for allergies or itching., Starting Sun 05/02/2020, Until Mon 05/02/2021 at 2359, Normal    predniSONE (DELTASONE) 50 MG tablet Take 1 tablet (50 mg total) by mouth daily with breakfast., Starting Sun 05/02/2020, Normal         Note:  This document was prepared using Dragon voice recognition software and may include unintentional dictation errors.   Lavonia Drafts, MD 05/02/20 2222

## 2020-05-06 ENCOUNTER — Ambulatory Visit: Payer: Commercial Managed Care - PPO | Admitting: Family Medicine

## 2020-05-06 ENCOUNTER — Other Ambulatory Visit: Payer: Self-pay

## 2020-05-06 VITALS — BP 139/78 | HR 84 | Ht 66.0 in | Wt 179.4 lb

## 2020-05-06 DIAGNOSIS — R87612 Low grade squamous intraepithelial lesion on cytologic smear of cervix (LGSIL): Secondary | ICD-10-CM | POA: Diagnosis not present

## 2020-05-06 DIAGNOSIS — I82A12 Acute embolism and thrombosis of left axillary vein: Secondary | ICD-10-CM

## 2020-05-06 DIAGNOSIS — L509 Urticaria, unspecified: Secondary | ICD-10-CM

## 2020-05-06 DIAGNOSIS — I10 Essential (primary) hypertension: Secondary | ICD-10-CM | POA: Diagnosis not present

## 2020-05-06 DIAGNOSIS — K900 Celiac disease: Secondary | ICD-10-CM | POA: Diagnosis not present

## 2020-05-06 MED ORDER — CETIRIZINE HCL 10 MG PO TABS: 10.0000 mg | ORAL_TABLET | Freq: Two times a day (BID) | ORAL | 2 refills | Status: DC

## 2020-05-06 MED ORDER — PREDNISONE 10 MG PO TABS: ORAL_TABLET | ORAL | 0 refills | Status: DC

## 2020-05-06 MED ORDER — EPINEPHRINE 0.3 MG/0.3ML IJ SOAJ: 0.3000 mg | INTRAMUSCULAR | 5 refills | Status: DC | PRN

## 2020-05-06 NOTE — Patient Instructions (Signed)
Stop Claritin.

## 2020-05-06 NOTE — Progress Notes (Signed)
Established patient visit   Patient: Leslie Jacobs   DOB: [birth year]   54 y.o. Female  MRN: [id number] Visit Date: 05/06/2020  Today's healthcare provider: Wilhemena Durie, MD   No chief complaint on file.  Subjective    HPI  Patient comes in today for follow-up of significant urticaria.  She was seen in the ED for this a few days ago.  She is convinced that she has alpha gal.  She has no wheezing or shortness of breath.  Patient does have a history of celiac's disease. Ear fullness, left From 04/02/2020-Counseled on using 2nd gen antihistamine and flonase. If flonase not effective, may consider dymista.       Medications: Outpatient Medications Prior to Visit  Medication Sig  . aspirin 81 MG EC tablet Take 81 mg by mouth daily. Swallow whole.  . ciprofloxacin (CIPRO) 750 MG tablet Take 1 tablet (750 mg total) by mouth 2 (two) times daily for 7 days.  Marland Kitchen doxycycline (VIBRA-TABS) 100 MG tablet Take 1 tablet (100 mg total) by mouth 2 (two) times daily. (Patient not taking: Reported on 04/02/2020)  . loratadine (CLARITIN) 10 MG tablet Take 1 tablet (10 mg total) by mouth daily as needed for allergies or itching.  Marland Kitchen MAGNESIUM PO Take by mouth.  . Multiple Vitamin (MULTIVITAMIN) capsule Take 1 capsule by mouth daily.  . norethindrone (MICRONOR,CAMILA,ERRIN) 0.35 MG tablet Take 1 tablet by mouth daily. (Patient not taking: Reported on 02/16/2020)  . predniSONE (DELTASONE) 50 MG tablet Take 1 tablet (50 mg total) by mouth daily with breakfast.  . TURMERIC PO Take by mouth daily.   Facility-Administered Medications Prior to Visit  Medication Dose Route Frequency Provider  . triamcinolone acetonide (KENALOG) 10 MG/ML injection 10 mg  10 mg Other Once Landis Martins, DPM    Review of Systems  Constitutional: Negative for appetite change, chills, fatigue and fever.  Respiratory: Negative for chest tightness and shortness of breath.   Cardiovascular: Negative for chest pain and  palpitations.  Gastrointestinal: Negative for abdominal pain, nausea and vomiting.  Neurological: Negative for dizziness and weakness.       Objective    LMP 01/19/2016  BP Readings from Last 3 Encounters:  05/06/20 139/78  05/02/20 (!) 160/77  04/30/20 129/79   Wt Readings from Last 3 Encounters:  05/06/20 179 lb 6.4 oz (81.4 kg)  05/02/20 176 lb (79.8 kg)  04/30/20 176 lb (79.8 kg)      Physical Exam Vitals reviewed.  Constitutional:      Appearance: Normal appearance.  HENT:     Head: Normocephalic and atraumatic.     Right Ear: External ear normal.     Left Ear: External ear normal.  Eyes:     General: No scleral icterus.    Conjunctiva/sclera: Conjunctivae normal.  Cardiovascular:     Rate and Rhythm: Normal rate and regular rhythm.     Pulses: Normal pulses.     Heart sounds: Normal heart sounds.  Pulmonary:     Effort: Pulmonary effort is normal.     Breath sounds: Normal breath sounds.  Musculoskeletal:     Right lower leg: No edema.     Left lower leg: No edema.  Skin:    General: Skin is warm and dry.     Comments: She has significant areas of urticaria on her trunk and extremities.  Neurological:     General: No focal deficit present.     Mental Status: She is alert  and oriented to person, place, and time.  Psychiatric:        Mood and Affect: Mood normal.        Behavior: Behavior normal.        Thought Content: Thought content normal.        Judgment: Judgment normal.        No results found for any visits on 05/06/20.  Assessment & Plan     1. Urticaria Patient is given EpiPen.  Will obtain lab work as below and check for alpha gal.  Refer to Dr. Donneta Romberg from allergy.  At this time will stop Claritin and try Zyrtec 10 mg twice a day in addition to Benadryl 25 mg 4 times daily as needed and prednisone 10 mg 6-day taper.  More than 50% 25 minute visit spent in counseling or coordination of care  - Alpha-Gal Panel - CBC w/Diff/Platelet -  Sed Rate (ESR) - Ambulatory referral to Allergy - cetirizine (ZYRTEC) 10 MG tablet; Take 1 tablet (10 mg total) by mouth 2 (two) times daily.  Dispense: 60 tablet; Refill: 2 - predniSONE (DELTASONE) 10 MG tablet; Take 6 tablets day 1, 5 tablets day 2, 4 tablets day 3, 3 tablets day 4, 2 tablets day 5, 1 tablet day 6  Dispense: 21 tablet; Refill: 0  2. Essential hypertension   3. Celiac disease/sprue   4. Low grade squamous intraepithelial lesion (LGSIL) on Papanicolaou smear of cervix    No follow-ups on file.         Kaliegh Willadsen Cranford Mon, MD  [facility] [phone] (phone) [phone] (fax)  [place]

## 2020-05-07 ENCOUNTER — Telehealth: Payer: Self-pay | Admitting: *Deleted

## 2020-05-07 NOTE — Telephone Encounter (Signed)
Per Maurine Minister patient was to go to ED. Patient stated she will just keep an eye on symptoms, but for now she will not go to ED.

## 2020-05-07 NOTE — Telephone Encounter (Signed)
Copied from CRM [id number]. Topic: General - Inquiry >> May 07, 2020 11:36 AM Leslie Jacobs D wrote: Reason for CRM: Pt called saying she was in yesterday with terrible hives.  She seen Dr. Sullivan Lone.  She is calling back today saying she is no better and in face worse than yesterday.  Her lips and eyes are swollen.  She is taking everything he told her to take.  She said it is not in her throat and can breath but would like someone to call her back.  [phone]

## 2020-05-10 ENCOUNTER — Telehealth: Payer: Self-pay | Admitting: Family Medicine

## 2020-05-10 NOTE — Telephone Encounter (Signed)
Patient is calling because she is breaking out in hives after taking 2 rounds of prednisone. Patient needs to know her test results of Alpha-Gal Panel before making an appt with the allergist. Is there anything else she can do? Please advise CB- [phone]

## 2020-05-10 NOTE — Telephone Encounter (Signed)
Please advise? Test is not back yet.

## 2020-05-10 NOTE — Telephone Encounter (Signed)
Not back --we will call when results are back

## 2020-05-11 NOTE — Telephone Encounter (Signed)
Patient's result is not back yet. Please advise further treatment?

## 2020-05-11 NOTE — Telephone Encounter (Signed)
Pt calling back.  Results not released. Pt stats she needs t know something today, as she is still breaking out.  Lips swollen, breaking out on face ans head. Please advise

## 2020-05-12 NOTE — Telephone Encounter (Signed)
We will call when we get the results back.

## 2020-05-12 NOTE — Telephone Encounter (Signed)
Patient advised.

## 2020-05-12 NOTE — Telephone Encounter (Signed)
Patient called in again anxiously waiting on her results. Please call Ph# [phone]

## 2020-05-13 DIAGNOSIS — I82A12 Acute embolism and thrombosis of left axillary vein: Secondary | ICD-10-CM | POA: Insufficient documentation

## 2020-05-14 NOTE — Telephone Encounter (Signed)
PT calling back foe an update her lab results / please advise

## 2020-05-17 NOTE — Telephone Encounter (Signed)
Please review. Alpha-Gal is still pending FYI. Thanks!

## 2020-05-17 NOTE — Telephone Encounter (Signed)
Pt is extremely upset about Alpha-Gal results not in and no return calls. She is ok with a do over but srtates that her calls are not being returned and she could have already redone the test. She was promised that today by "Thayer Ohm" someone would def call her and no return calls. She want Dr Reece Agar to know that this is not being addressed. [phone]

## 2020-05-17 NOTE — Telephone Encounter (Signed)
Pt. Calling for lab results. Please advise.

## 2020-05-18 LAB — CBC WITH DIFFERENTIAL/PLATELET
Basophils Absolute: 0 10*3/uL (ref 0.0–0.2)
Basos: 0 %
EOS (ABSOLUTE): 0 10*3/uL (ref 0.0–0.4)
Eos: 0 %
Hematocrit: 42.7 % (ref 34.0–46.6)
Hemoglobin: 14.3 g/dL (ref 11.1–15.9)
Immature Grans (Abs): 0.1 10*3/uL (ref 0.0–0.1)
Immature Granulocytes: 1 %
Lymphocytes Absolute: 1 10*3/uL (ref 0.7–3.1)
Lymphs: 11 %
MCH: 30.2 pg (ref 26.6–33.0)
MCHC: 33.5 g/dL (ref 31.5–35.7)
MCV: 90 fL (ref 79–97)
Monocytes Absolute: 0.3 10*3/uL (ref 0.1–0.9)
Monocytes: 3 %
Neutrophils Absolute: 8 10*3/uL — ABNORMAL HIGH (ref 1.4–7.0)
Neutrophils: 85 %
Platelets: 248 10*3/uL (ref 150–450)
RBC: 4.74 x10E6/uL (ref 3.77–5.28)
RDW: 12.5 % (ref 11.7–15.4)
WBC: 9.4 10*3/uL (ref 3.4–10.8)

## 2020-05-18 LAB — SEDIMENTATION RATE: Sed Rate: 14 mm/hr (ref 0–40)

## 2020-05-18 LAB — ALPHA-GAL PANEL
Alpha Gal IgE*: <0.1 kU/L (ref <0.10)
Beef (Bos spp) IgE: <0.1 kU/L (ref <0.35)
Class Interpretation: 0
Class Interpretation: 0
Class Interpretation: 0
Lamb/Mutton (Ovis spp) IgE: <0.1 kU/L (ref <0.35)
Pork (Sus spp) IgE: <0.1 kU/L (ref <0.35)

## 2020-05-18 NOTE — Telephone Encounter (Signed)
Patient advised as directed below. PEr patient she is scheduled with the allergist.

## 2021-02-28 ENCOUNTER — Other Ambulatory Visit: Payer: Self-pay | Admitting: Obstetrics and Gynecology

## 2021-05-17 ENCOUNTER — Encounter: Payer: Self-pay | Admitting: Obstetrics and Gynecology

## 2021-05-17 ENCOUNTER — Other Ambulatory Visit: Payer: Self-pay

## 2021-05-17 ENCOUNTER — Ambulatory Visit: Payer: Commercial Managed Care - PPO | Admitting: Obstetrics and Gynecology

## 2021-05-17 VITALS — BP 129/80 | HR 76 | Ht 66.0 in | Wt 175.0 lb

## 2021-05-17 DIAGNOSIS — Z01419 Encounter for gynecological examination (general) (routine) without abnormal findings: Secondary | ICD-10-CM | POA: Diagnosis not present

## 2021-05-17 NOTE — Progress Notes (Signed)
Subjective:     Leslie Jacobs is a 55 y.o. female postmenopausal with BMI 28 who is here for a comprehensive physical exam. The patient reports no problems. She is sexually active without complaints. She denies pelvic pain or abnormal discharge. She denies any episodes of vaginal bleeding. She denies urinary incontinence. Patient is without any complaints  No past medical history on file. No past surgical history on file. Family History  Problem Relation Age of Onset   Diabetes Mother    Stroke Mother    Heart disease Mother    Hypertension Mother    COPD Father    Hypertension Father    Diabetes Maternal Grandmother    Stroke Maternal Grandmother    Cancer Maternal Grandfather    Breast cancer Neg Hx     Social History   Socioeconomic History   Marital status: Unknown    Spouse name: Not on file   Number of children: Not on file   Years of education: Not on file   Highest education level: Not on file  Occupational History   Not on file  Tobacco Use   Smoking status: Never   Smokeless tobacco: Never  Substance and Sexual Activity   Alcohol use: Never   Drug use: Never   Sexual activity: Yes    Birth control/protection: Pill    Comment: occasional pain  Other Topics Concern   Not on file  Social History Narrative   Not on file   Social Determinants of Health   Financial Resource Strain: Not on file  Food Insecurity: Not on file  Transportation Needs: Not on file  Physical Activity: Not on file  Stress: Not on file  Social Connections: Not on file  Intimate Partner Violence: Not on file   Health Maintenance  Topic Date Due   COVID-19 Vaccine (1) Never done   HIV Screening  Never done   Hepatitis C Screening  Never done   TETANUS/TDAP  Never done   INFLUENZA VACCINE  02/21/2021   MAMMOGRAM  03/05/2022   PAP SMEAR-Modifier  01/21/2024   COLONOSCOPY (Pts 45-68yrs Insurance coverage will need to be confirmed)  04/12/2025   Zoster Vaccines- Shingrix   Completed   Pneumococcal Vaccine 66-52 Years old  Aged Out   HPV VACCINES  Aged Out       Review of Systems Pertinent items noted in HPI and remainder of comprehensive ROS otherwise negative.   Objective:  Blood pressure 129/80, pulse 76, height 5\' 6"  (1.676 m), weight 175 lb (79.4 kg), last menstrual period 01/19/2016.   GENERAL: Well-developed, well-nourished female in no acute distress.  HEENT: Normocephalic, atraumatic. Sclerae anicteric.  NECK: Supple. Normal thyroid.  LUNGS: Clear to auscultation bilaterally.  HEART: Regular rate and rhythm. BREASTS: Symmetric in size. No palpable masses or lymphadenopathy, skin changes, or nipple drainage. ABDOMEN: Soft, nontender, nondistended. No organomegaly. PELVIC: Normal external female genitalia. Vagina is pink and rugated.  Normal discharge. Normal appearing cervix. Uterus is normal in size. No adnexal mass or tenderness. Chaperone present during the pelvic exam EXTREMITIES: No cyanosis, clubbing, or edema, 2+ distal pulses.     Assessment:    Healthy female exam.      Plan:    Pap smear normal in 2020 Screening mammogram ordered Patient is up to date with colonoscopy Patient will be contacted with abnormal discharge See After Visit Summary for Counseling Recommendations

## 2021-06-10 ENCOUNTER — Other Ambulatory Visit: Payer: Self-pay

## 2021-06-10 ENCOUNTER — Ambulatory Visit
Admission: RE | Admit: 2021-06-10 | Discharge: 2021-06-10 | Disposition: A | Discharge status: Home / Self Care | Payer: Commercial Managed Care - PPO | Source: Ambulatory Visit | Attending: Obstetrics and Gynecology | Admitting: Obstetrics and Gynecology

## 2021-06-10 DIAGNOSIS — Z1231 Encounter for screening mammogram for malignant neoplasm of breast: Secondary | ICD-10-CM | POA: Diagnosis not present

## 2021-06-10 DIAGNOSIS — Z01419 Encounter for gynecological examination (general) (routine) without abnormal findings: Secondary | ICD-10-CM

## 2021-07-21 NOTE — Progress Notes (Signed)
Established patient visit   Patient: Leslie Jacobs   DOB: [birth year]   55 y.o. Female  MRN: [id number] Visit Date: 07/22/2021  Today's healthcare provider: Lelon Huh, MD   No chief complaint on file.  Subjective    HPI  Rash Patient presents for evaluation of a rash involving the face. Rash started 6 days ago. Was red and itchy and tip of nose blistered. Rash has changed over time. Rash  was causing pain but not currently . Associated symptoms: fever, headache, sore throat, and fatigue, itchy and swollen lymph nodes  . Patient denies: abdominal pain, arthralgia, congestion, cough, crankiness, decrease in appetite, decrease in energy level, irritability, myalgia, nausea, and vomiting. Patient has not had contacts with similar rash. Patient has not had new exposures (soaps, lotions, laundry detergents, foods, medications, plants, insects or animals). Reports starting collagen medication 2 months ago. Still experiencing discoloration but not as bad.    -Currently taking zyrtec 40 mg BID  -Reports that Friday the tip of her nose was sore and a little red feeling like she would get a bump. Friday night neck where lymph nodes was sore and hurting. Saturday when she woke up it was swollen and then she developed a fever of 101.5 and last until Christmas Day that afternoon. Bridge of nose and spreading across the top of her cheeks and was swollen. Believed it was the hives due to recent fever. Patient has pictures available to view. -Reports no pain in lymph nodes today. -Took 3 covid test and they were all negative  She saw Dr Donneta Romberg earlier this week when the rash was worse and he suggested she be tested for Lupus   Medications: Outpatient Medications Prior to Visit  Medication Sig   aspirin 81 MG EC tablet Take 81 mg by mouth daily. Swallow whole.   cetirizine (ZYRTEC) 10 MG tablet Take 1 tablet (10 mg total) by mouth 2 (two) times daily.   diphenhydrAMINE (BENADRYL) 25 mg  capsule Take 25 mg by mouth as needed.   EPINEPHrine 0.3 mg/0.3 mL IJ SOAJ injection Inject 0.3 mg into the muscle as needed for anaphylaxis.   famotidine (PEPCID) 40 MG tablet Take 40 mg by mouth daily.   levocetirizine (XYZAL) 5 MG tablet Take 5 mg by mouth every evening.   loratadine (CLARITIN) 10 MG tablet Take 1 tablet (10 mg total) by mouth daily as needed for allergies or itching.   MAGNESIUM PO Take by mouth.   Multiple Vitamin (MULTIVITAMIN) capsule Take 1 capsule by mouth daily.   Turmeric (QC TUMERIC COMPLEX PO) Take by mouth.   No facility-administered medications prior to visit.    Review of Systems     Objective    LMP 01/19/2016  BP Readings from Last 3 Encounters:  05/17/21 129/80  05/06/20 139/78  05/02/20 (!) 160/77   Wt Readings from Last 3 Encounters:  07/22/21 183 lb 1.6 oz (83.1 kg)  05/17/21 175 lb (79.4 kg)  05/06/20 179 lb 6.4 oz (81.4 kg)      Physical Exam   Rash is obscured by make up today. She brings several photos from earlier this week showing bright bilateral malar macular erythema, no rash on forehead.     Assessment & Plan     1. Malar rash Rapid onset and now resolving associated with multiple other inflammatory symptoms suggestive of viral exanthem. Consider her other chronic medical issues it will screen for autoimmune disorder.   - ANA Direct w/Reflex  if Positive - CBC with Differential/Platelet         The entirety of the information documented in the History of Present Illness, Review of Systems and Physical Exam were personally obtained by me. Portions of this information were initially documented by the CMA and reviewed by me for thoroughness and accuracy.     Mila Merry, MD  [facility] [phone] (phone) [phone] (fax)  [facility]

## 2021-07-22 ENCOUNTER — Other Ambulatory Visit: Payer: Self-pay

## 2021-07-22 ENCOUNTER — Ambulatory Visit: Payer: Commercial Managed Care - PPO | Admitting: Family Medicine

## 2021-07-22 ENCOUNTER — Encounter: Payer: Self-pay | Admitting: Family Medicine

## 2021-07-22 VITALS — BP 136/81 | HR 68 | Temp 97.8°F | Ht 66.0 in | Wt 183.1 lb

## 2021-07-22 DIAGNOSIS — R21 Rash and other nonspecific skin eruption: Secondary | ICD-10-CM | POA: Diagnosis not present

## 2021-07-23 LAB — ANA W/REFLEX IF POSITIVE: Anti Nuclear Antibody (ANA): NEGATIVE

## 2021-07-23 LAB — CBC WITH DIFFERENTIAL/PLATELET
Basophils Absolute: 0.1 10*3/uL (ref 0.0–0.2)
Basos: 1 %
EOS (ABSOLUTE): 0 10*3/uL (ref 0.0–0.4)
Eos: 0 %
Hematocrit: 41.3 % (ref 34.0–46.6)
Hemoglobin: 14 g/dL (ref 11.1–15.9)
Immature Grans (Abs): 0.2 10*3/uL — ABNORMAL HIGH (ref 0.0–0.1)
Immature Granulocytes: 2 %
Lymphocytes Absolute: 2 10*3/uL (ref 0.7–3.1)
Lymphs: 21 %
MCH: 30.2 pg (ref 26.6–33.0)
MCHC: 33.9 g/dL (ref 31.5–35.7)
MCV: 89 fL (ref 79–97)
Monocytes Absolute: 0.5 10*3/uL (ref 0.1–0.9)
Monocytes: 6 %
Neutrophils Absolute: 6.8 10*3/uL (ref 1.4–7.0)
Neutrophils: 70 %
Platelets: 251 10*3/uL (ref 150–450)
RBC: 4.63 x10E6/uL (ref 3.77–5.28)
RDW: 12.6 % (ref 11.7–15.4)
WBC: 9.6 10*3/uL (ref 3.4–10.8)

## 2022-01-16 ENCOUNTER — Telehealth: Payer: Self-pay | Admitting: Family Medicine

## 2022-01-19 ENCOUNTER — Telehealth: Payer: Self-pay

## 2022-01-19 NOTE — Telephone Encounter (Signed)
Copied from CRM [id number]. Topic: Referral - Status >> Jan 19, 2022 11:50 AM Macon Large wrote: Reason for CRM: Pt requests update on the referral request to GI. Pt stated she has not heard back from anyone and she really needs to get the appt scheduled. Pt requests call back asap today to advise. Cb# [phone]

## 2022-01-20 NOTE — Telephone Encounter (Signed)
Patient scheduled for 01/23/22.

## 2022-01-23 ENCOUNTER — Encounter: Payer: Self-pay | Admitting: Family Medicine

## 2022-01-23 ENCOUNTER — Ambulatory Visit: Payer: Commercial Managed Care - PPO | Admitting: Family Medicine

## 2022-01-23 VITALS — BP 131/73 | HR 64 | Temp 98.9°F | Resp 16 | Wt 172.6 lb

## 2022-01-23 DIAGNOSIS — K900 Celiac disease: Secondary | ICD-10-CM | POA: Diagnosis not present

## 2022-01-23 DIAGNOSIS — L130 Dermatitis herpetiformis: Secondary | ICD-10-CM

## 2022-01-23 NOTE — Progress Notes (Signed)
I,[name],acting as a scribe for [name], MD.,have documented all relevant documentation on the behalf of [name], MD,as directed by  [name], MD while in the presence of [name], MD.   Established patient visit   Patient: Leslie Jacobs   DOB: [birth year]   56 y.o. Female  MRN: [id number] Visit Date: 01/23/2022  Today's healthcare provider: [name], MD   Chief Complaint  Patient presents with   Referral   Subjective    HPI  Patient here for referral to GI. 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  Patient was diagnosed with celiac disease and has done well on a gluten-free diet since that time.  To her knowledge she is having no GI symptoms at all.  Due to the diagnosis of dermatitis herpetiformis she is stopped her allergy medicines as she was being treated for urticaria which she does not. Dr. Adolphus Birchwood is started on dapsone which she could not tolerate and she is trying to get take a lower dose.  The rash continues to be pruritic when she has it.  Medications: Outpatient Medications Prior to Visit  Medication Sig   aspirin 81 MG EC tablet Take 81 mg by mouth daily. Swallow whole.   MAGNESIUM PO Take by mouth.   Multiple Vitamin (MULTIVITAMIN) capsule Take 1 capsule by mouth daily.   cetirizine (ZYRTEC) 10 MG tablet Take 1 tablet (10 mg total) by mouth 2 (two) times daily.   diphenhydrAMINE (BENADRYL) 25 mg capsule Take 25 mg by mouth as needed.   EPINEPHrine 0.3 mg/0.3 mL IJ SOAJ injection Inject 0.3 mg into the muscle as needed for anaphylaxis.   famotidine (PEPCID) 40 MG tablet Take 40 mg by mouth daily.   levocetirizine (XYZAL) 5 MG tablet Take 5 mg by mouth every evening.   loratadine (CLARITIN) 10 MG tablet Take 1 tablet (10 mg total) by mouth daily as needed for allergies or itching.   Turmeric (QC TUMERIC COMPLEX PO) Take by mouth.   No facility-administered medications prior  to visit.    Review of Systems  Last CBC Lab Results  Component Value Date   WBC 9.6 07/22/2021   HGB 14.0 07/22/2021   HCT 41.3 07/22/2021   MCV 89 07/22/2021   MCH 30.2 07/22/2021   RDW 12.6 07/22/2021   PLT 251 07/22/2021       Objective    BP 131/73 (BP Location: Right Arm, Patient Position: Sitting, Cuff Size: Normal)   Pulse 64   Temp 98.9 F (37.2 C) (Oral)   Resp 16   Wt 172 lb 9.6 oz (78.3 kg)   LMP 01/19/2016   BMI 27.86 kg/m  BP Readings from Last 3 Encounters:  01/23/22 131/73  07/22/21 136/81  05/17/21 129/80   Wt Readings from Last 3 Encounters:  01/23/22 172 lb 9.6 oz (78.3 kg)  07/22/21 183 lb 1.6 oz (83.1 kg)  05/17/21 175 lb (79.4 kg)      Physical Exam Vitals reviewed.  Constitutional:      General: She is not in acute distress.    Appearance: She is well-developed.  HENT:     Head: Normocephalic and atraumatic.     Right Ear: Hearing normal.     Left Ear: Hearing normal.     Nose: Nose normal.  Eyes:     General: Lids are normal. No scleral icterus.       Right eye: No discharge.  Left eye: No discharge.     Conjunctiva/sclera: Conjunctivae normal.  Cardiovascular:     Rate and Rhythm: Normal rate and regular rhythm.     Heart sounds: Normal heart sounds.  Pulmonary:     Effort: Pulmonary effort is normal. No respiratory distress.  Skin:    General: Skin is warm and dry.     Findings: No lesion or rash.     Comments: 2 very small lesions on her left hand which are new to the patient.  Neurological:     General: No focal deficit present.     Mental Status: She is alert and oriented to person, place, and time.  Psychiatric:        Mood and Affect: Mood normal.        Speech: Speech normal.        Behavior: Behavior normal.        Thought Content: Thought content normal.        Judgment: Judgment normal.       No results found for any visits on 01/23/22.  Assessment & Plan     1. Celiac disease/sprue For back to  GI for their opinion. - Ambulatory referral to Gastroenterology for their opinion regarding this association It is also time for routine screening colonoscopy I believe  2. Dermatitis herpetiformis Treated by Dr. Adolphus Birchwood from dermatology I will see her back in January 24 for physical   No follow-ups on file.      I, [name], MD, have reviewed all documentation for this visit. The documentation on 01/23/22 for the exam, diagnosis, procedures, and orders are all accurate and complete.    Amanii Snethen Wendelyn Breslow, MD  [facility] [phone] (phone) [phone] (fax)  [facility]

## 2022-05-18 DIAGNOSIS — T783XXA Angioneurotic edema, initial encounter: Secondary | ICD-10-CM | POA: Insufficient documentation

## 2022-05-24 ENCOUNTER — Other Ambulatory Visit: Payer: Self-pay | Admitting: Obstetrics and Gynecology

## 2022-05-24 DIAGNOSIS — Z1231 Encounter for screening mammogram for malignant neoplasm of breast: Secondary | ICD-10-CM

## 2022-05-29 ENCOUNTER — Other Ambulatory Visit: Payer: Self-pay

## 2022-05-30 ENCOUNTER — Encounter: Payer: Self-pay | Admitting: Gastroenterology

## 2022-05-30 ENCOUNTER — Ambulatory Visit: Payer: Commercial Managed Care - PPO | Admitting: Gastroenterology

## 2022-05-30 ENCOUNTER — Other Ambulatory Visit: Payer: Self-pay

## 2022-05-30 VITALS — BP 138/77 | HR 80 | Temp 98.1°F | Ht 66.0 in | Wt 169.2 lb

## 2022-05-30 DIAGNOSIS — L130 Dermatitis herpetiformis: Secondary | ICD-10-CM | POA: Diagnosis not present

## 2022-05-30 DIAGNOSIS — K900 Celiac disease: Secondary | ICD-10-CM | POA: Diagnosis not present

## 2022-05-30 DIAGNOSIS — Z8719 Personal history of other diseases of the digestive system: Secondary | ICD-10-CM

## 2022-05-30 NOTE — Progress Notes (Signed)
Arlyss Repress, MD [address]  [address]  [place], [place] [address]  Main: [phone]  Fax: [phone]    Gastroenterology Consultation  Referring Provider:     Maple Hudson.,* Primary Care Physician:  Maple Hudson., MD Primary Gastroenterologist:  Dr. Arlyss Repress Reason for Consultation: Celiac disease        HPI:   Leslie Jacobs is a 56 y.o. female referred by Dr. Sullivan Lone, Leonette Monarch., MD  for consultation & management of celiac disease.  Patient is diagnosed with celiac disease for work-up of iron deficiency anemia.  She underwent upper endoscopy by Dr. Mechele Collin on 05/16/2006, found to have diffuse atrophic mucosa in the duodenal bulb and second portion of duodenum, biopsies confirmed celiac disease.  Pathology demonstrated partial villous atrophy, crypt hyperplasia and mild intraepithelial lymphocytes in the second portion of duodenum, partial villous atrophy, moderate intraepithelial lymphocytes and prominent plasmacytosis in the duodenal bulb.  Gastric biopsies were unremarkable. TTG IgA was elevated to 10 on 05/28/2006, Patient went on a gluten-free diet, repeat celiac serologies revealed normal TTG IgA levels on 09/05/2006.  Patient went on a strict gluten-free diet since the diagnosis of celiac disease.  Around age 72, she was diagnosed with dermatitis to his herpetiformis which he outgrew it.  In the recent past, patient noticed flareup of dermatitis herpetiformis, has seen a dermatologist, underwent punch biopsies which confirmed DH.  She tried dapsone 50 mg which resulted in severe nausea loss of appetite, tried 25 mg, could not tolerate it.  She has vesicular lesions on the scalp, knees, elbows and sometimes on forearm.  Patient has been eating out lately.  She does report chronic constipation, denies any other GI symptoms.  She used to prepare food at home previously. No evidence of iron deficiency anemia.  Patient did not have repeat endoscopy  since her diagnosis.  Celiac serologies were not checked as well  Patient does not smoke or drink alcohol  NSAIDs: None  Antiplts/Anticoagulants/Anti thrombotics: None  GI Procedures:  Colonoscopy 2016, reportedly normal Upper endoscopy by Dr. Mechele Collin on 05/16/2006, found to have diffuse atrophic mucosa in the duodenal bulb and second portion of duodenum, biopsies confirmed celiac disease.  Pathology demonstrated partial villous atrophy, crypt hyperplasia and mild intraepithelial lymphocytes in the second portion of duodenum, partial villous atrophy, moderate intraepithelial lymphocytes and prominent plasmacytosis in the duodenal bulb.  Gastric biopsies were unremarkable.  Past Medical History:  Diagnosis Date   DVT (deep venous thrombosis) (HCC)     Past Surgical History:  Procedure Laterality Date   ELBOW ARTHROSCOPY       Current Outpatient Medications:    aspirin 81 MG EC tablet, Take 81 mg by mouth daily. Swallow whole., Disp: , Rfl:    MAGNESIUM PO, Take by mouth., Disp: , Rfl:    mometasone (ELOCON) 0.1 % cream, 1 application Externally Twice a day for 30 days, Disp: , Rfl:    Family History  Problem Relation Age of Onset   Diabetes Mother    Stroke Mother    Heart disease Mother    Hypertension Mother    COPD Father    Hypertension Father    Diabetes Maternal Grandmother    Stroke Maternal Grandmother    Cancer Maternal Grandfather    Breast cancer Neg Hx      Social History   Tobacco Use   Smoking status: Never   Smokeless tobacco: Never  Substance Use Topics  Alcohol use: Never   Drug use: Never    Allergies as of 05/30/2022 - Review Complete 05/30/2022  Allergen Reaction Noted   Hydrocodone-acetaminophen Nausea Only    Gluten meal  03/15/2015    Review of Systems:    All systems reviewed and negative except where noted in HPI.   Physical Exam:  BP 138/77 (BP Location: Left Arm, Patient Position: Sitting, Cuff Size: Normal)   Pulse 80   Temp  98.1 F (36.7 C) (Oral)   Ht 5\' 6"  (1.676 m)   Wt 169 lb 4 oz (76.8 kg)   LMP 01/19/2016   BMI 27.32 kg/m  Patient's last menstrual period was 01/19/2016.  General:   Alert,  Well-developed, well-nourished, pleasant and cooperative in NAD Head:  Normocephalic and atraumatic. Eyes:  Sclera clear, no icterus.   Conjunctiva pink. Ears:  Normal auditory acuity. Nose:  No deformity, discharge, or lesions. Mouth:  No deformity or lesions,oropharynx pink & moist. Neck:  Supple; no masses or thyromegaly. Lungs:  Respirations even and unlabored.  Clear throughout to auscultation.   No wheezes, crackles, or rhonchi. No acute distress. Heart:  Regular rate and rhythm; no murmurs, clicks, rubs, or gallops. Abdomen:  Normal bowel sounds. Soft, non-tender and non-distended without masses, hepatosplenomegaly or hernias noted.  No guarding or rebound tenderness.   Rectal: Not performed Msk:  Symmetrical without gross deformities. Good, equal movement & strength bilaterally. Pulses:  Normal pulses noted. Extremities:  No clubbing or edema.  No cyanosis. Neurologic:  Alert and oriented x3;  grossly normal neurologically. Skin: Faint, mild vesicular lesions on forearm, above right eyebrow, dry scaly erythematous rash on bilateral elbows Otherwise intact without significant lesions or rashes. No jaundice. Psych:  Alert and cooperative. Normal mood and affect.  Imaging Studies: None  Assessment and Plan:   Leslie Jacobs is a 56 y.o. pleasant Caucasian female with celiac disease, biopsy-proven, dermatitis herpetiformis  There is a possibility of cross-contamination with patient eating out on a regular basis Check celiac disease panel Perform EGD with duodenal biopsies, bulb and second portion of duodenum separately Check vitamin D levels Discussed with him regarding various resources for gluten-free diet, to explore into opportunity for a telehealth visit with the celiac expert  dietitian  Chronic constipation Discussed about high-fiber diet   Follow up in 4 months   Cephas Darby, MD

## 2022-06-01 LAB — VITAMIN D 25 HYDROXY (VIT D DEFICIENCY, FRACTURES): Vit D, 25-Hydroxy: 37.2 ng/mL (ref 30.0–100.0)

## 2022-06-01 LAB — CELIAC DISEASE PANEL
Endomysial IgA: NEGATIVE
IgA/Immunoglobulin A, Serum: 213 mg/dL (ref 87–352)
Transglutaminase IgA: <2 U/mL (ref 0–3)

## 2022-06-06 ENCOUNTER — Telehealth: Payer: Self-pay

## 2022-06-06 NOTE — Telephone Encounter (Signed)
Patient left a voicemail and needs to rescheduled EGD that is schedule 06/14/2022. Reschedule patient to 07/26/22. Sent new instructions to patient.

## 2022-06-07 NOTE — Telephone Encounter (Signed)
Called Trish and got patient moved in ENDO

## 2022-06-13 ENCOUNTER — Ambulatory Visit
Admission: RE | Admit: 2022-06-13 | Discharge: 2022-06-13 | Disposition: A | Discharge status: Home / Self Care | Payer: Commercial Managed Care - PPO | Source: Ambulatory Visit | Attending: Obstetrics and Gynecology | Admitting: Obstetrics and Gynecology

## 2022-06-13 DIAGNOSIS — Z1231 Encounter for screening mammogram for malignant neoplasm of breast: Secondary | ICD-10-CM | POA: Insufficient documentation

## 2022-07-11 ENCOUNTER — Telehealth: Payer: Self-pay | Admitting: Gastroenterology

## 2022-07-11 NOTE — Telephone Encounter (Signed)
Called and left a message for call back to reschedule procedure  

## 2022-07-11 NOTE — Telephone Encounter (Signed)
Patient states she does not think she needs the EGD done so she wants to cancel the procedure. Called ENDO and talk to vicki and she will get procedure canceled

## 2022-07-11 NOTE — Telephone Encounter (Signed)
Pt called and cancelled up coming procedure and will call back and resched after the new year

## 2022-07-26 ENCOUNTER — Encounter: Payer: Commercial Managed Care - PPO | Admitting: Family Medicine

## 2022-07-26 ENCOUNTER — Encounter: Admission: RE | Payer: Self-pay | Source: Home / Self Care

## 2022-07-26 ENCOUNTER — Ambulatory Visit
Admission: RE | Admit: 2022-07-26 | Payer: Commercial Managed Care - PPO | Source: Home / Self Care | Admitting: Gastroenterology

## 2022-07-26 SURGERY — ESOPHAGOGASTRODUODENOSCOPY (EGD) WITH PROPOFOL
Anesthesia: General

## 2022-08-08 NOTE — Telephone Encounter (Signed)
error 

## 2022-10-09 ENCOUNTER — Ambulatory Visit: Payer: Commercial Managed Care - PPO | Admitting: Gastroenterology

## 2023-06-05 ENCOUNTER — Other Ambulatory Visit: Payer: Self-pay | Admitting: Obstetrics and Gynecology

## 2023-06-05 DIAGNOSIS — Z1231 Encounter for screening mammogram for malignant neoplasm of breast: Secondary | ICD-10-CM

## 2023-06-20 ENCOUNTER — Ambulatory Visit
Admission: RE | Admit: 2023-06-20 | Discharge: 2023-06-20 | Disposition: A | Discharge status: Home / Self Care | Payer: Commercial Managed Care - PPO | Source: Ambulatory Visit | Attending: Obstetrics and Gynecology | Admitting: Obstetrics and Gynecology

## 2023-06-20 DIAGNOSIS — Z1231 Encounter for screening mammogram for malignant neoplasm of breast: Secondary | ICD-10-CM | POA: Diagnosis present

## 2024-07-14 ENCOUNTER — Other Ambulatory Visit: Payer: Self-pay | Admitting: Obstetrics and Gynecology

## 2024-07-14 DIAGNOSIS — Z1231 Encounter for screening mammogram for malignant neoplasm of breast: Secondary | ICD-10-CM

## 2024-07-18 ENCOUNTER — Ambulatory Visit
Admission: RE | Admit: 2024-07-18 | Discharge: 2024-07-18 | Disposition: A | Discharge status: Home / Self Care | Source: Ambulatory Visit | Attending: Obstetrics and Gynecology | Admitting: Obstetrics and Gynecology

## 2024-07-18 DIAGNOSIS — Z1231 Encounter for screening mammogram for malignant neoplasm of breast: Secondary | ICD-10-CM | POA: Diagnosis present
